# Patient Record
Sex: Female | Born: 1945 | Race: White | Hispanic: No | Marital: Married | Smoking: Never smoker
Health system: Southern US, Community
[De-identification: ages and names within clinical notes are randomized; demographics above are authoritative.]

## PROBLEM LIST (undated history)

## (undated) DIAGNOSIS — M4800 Spinal stenosis, site unspecified: Secondary | ICD-10-CM

## (undated) DIAGNOSIS — G4730 Sleep apnea, unspecified: Secondary | ICD-10-CM

## (undated) DIAGNOSIS — G43909 Migraine, unspecified, not intractable, without status migrainosus: Secondary | ICD-10-CM

## (undated) DIAGNOSIS — K5790 Diverticulosis of intestine, part unspecified, without perforation or abscess without bleeding: Secondary | ICD-10-CM

## (undated) DIAGNOSIS — I10 Essential (primary) hypertension: Secondary | ICD-10-CM

## (undated) DIAGNOSIS — C50919 Malignant neoplasm of unspecified site of unspecified female breast: Secondary | ICD-10-CM

## (undated) DIAGNOSIS — G4733 Obstructive sleep apnea (adult) (pediatric): Secondary | ICD-10-CM

## (undated) DIAGNOSIS — M810 Age-related osteoporosis without current pathological fracture: Secondary | ICD-10-CM

## (undated) DIAGNOSIS — L719 Rosacea, unspecified: Secondary | ICD-10-CM

## (undated) DIAGNOSIS — M1990 Unspecified osteoarthritis, unspecified site: Secondary | ICD-10-CM

## (undated) DIAGNOSIS — E119 Type 2 diabetes mellitus without complications: Secondary | ICD-10-CM

## (undated) DIAGNOSIS — H269 Unspecified cataract: Secondary | ICD-10-CM

## (undated) DIAGNOSIS — E785 Hyperlipidemia, unspecified: Secondary | ICD-10-CM

## (undated) HISTORY — DX: Essential (primary) hypertension: I10

## (undated) HISTORY — DX: Type 2 diabetes mellitus without complications: E11.9

## (undated) HISTORY — DX: Migraine, unspecified, not intractable, without status migrainosus: G43.909

## (undated) HISTORY — PX: SHOULDER ARTHROSCOPY: SHX128

## (undated) HISTORY — PX: POLYPECTOMY: SHX149

## (undated) HISTORY — DX: Obstructive sleep apnea (adult) (pediatric): G47.33

## (undated) HISTORY — DX: Rosacea, unspecified: L71.9

## (undated) HISTORY — DX: Unspecified cataract: H26.9

## (undated) HISTORY — DX: Sleep apnea, unspecified: G47.30

## (undated) HISTORY — DX: Diverticulosis of intestine, part unspecified, without perforation or abscess without bleeding: K57.90

## (undated) HISTORY — DX: Spinal stenosis, site unspecified: M48.00

## (undated) HISTORY — PX: COLONOSCOPY: SHX174

## (undated) HISTORY — PX: KNEE ARTHROSCOPY: SUR90

## (undated) HISTORY — DX: Unspecified osteoarthritis, unspecified site: M19.90

## (undated) HISTORY — PX: TONSILLECTOMY: SUR1361

## (undated) HISTORY — DX: Age-related osteoporosis without current pathological fracture: M81.0

## (undated) HISTORY — DX: Hyperlipidemia, unspecified: E78.5

---

## 1999-08-12 ENCOUNTER — Encounter: Admission: RE | Admit: 1999-08-12 | Discharge: 1999-08-12 | Payer: Self-pay | Admitting: Family Medicine

## 1999-08-12 ENCOUNTER — Encounter: Payer: Self-pay | Admitting: Family Medicine

## 2000-08-23 ENCOUNTER — Encounter: Payer: Self-pay | Admitting: Family Medicine

## 2000-08-23 ENCOUNTER — Encounter: Admission: RE | Admit: 2000-08-23 | Discharge: 2000-08-23 | Payer: Self-pay | Admitting: Family Medicine

## 2000-08-25 ENCOUNTER — Encounter: Payer: Self-pay | Admitting: Family Medicine

## 2000-08-25 ENCOUNTER — Encounter: Admission: RE | Admit: 2000-08-25 | Discharge: 2000-08-25 | Payer: Self-pay | Admitting: Family Medicine

## 2001-07-21 ENCOUNTER — Other Ambulatory Visit: Admission: RE | Admit: 2001-07-21 | Discharge: 2001-07-21 | Payer: Self-pay | Admitting: Family Medicine

## 2001-08-25 ENCOUNTER — Encounter: Payer: Self-pay | Admitting: Family Medicine

## 2001-08-25 ENCOUNTER — Encounter: Admission: RE | Admit: 2001-08-25 | Discharge: 2001-08-25 | Payer: Self-pay | Admitting: Family Medicine

## 2001-10-12 ENCOUNTER — Ambulatory Visit (HOSPITAL_BASED_OUTPATIENT_CLINIC_OR_DEPARTMENT_OTHER): Admission: RE | Admit: 2001-10-12 | Discharge: 2001-10-12 | Payer: Self-pay | Admitting: Family Medicine

## 2001-12-01 ENCOUNTER — Ambulatory Visit (HOSPITAL_BASED_OUTPATIENT_CLINIC_OR_DEPARTMENT_OTHER): Admission: RE | Admit: 2001-12-01 | Discharge: 2001-12-01 | Payer: Self-pay | Admitting: Family Medicine

## 2002-11-21 ENCOUNTER — Encounter: Admission: RE | Admit: 2002-11-21 | Discharge: 2002-11-21 | Payer: Self-pay | Admitting: Family Medicine

## 2002-11-21 ENCOUNTER — Encounter: Payer: Self-pay | Admitting: Family Medicine

## 2002-12-18 ENCOUNTER — Other Ambulatory Visit: Admission: RE | Admit: 2002-12-18 | Discharge: 2002-12-18 | Payer: Self-pay | Admitting: Family Medicine

## 2003-11-22 ENCOUNTER — Encounter: Admission: RE | Admit: 2003-11-22 | Discharge: 2003-11-22 | Payer: Self-pay | Admitting: Family Medicine

## 2004-01-11 ENCOUNTER — Other Ambulatory Visit: Admission: RE | Admit: 2004-01-11 | Discharge: 2004-01-11 | Payer: Self-pay | Admitting: Family Medicine

## 2005-01-02 ENCOUNTER — Encounter: Admission: RE | Admit: 2005-01-02 | Discharge: 2005-01-02 | Payer: Self-pay | Admitting: Family Medicine

## 2005-02-09 ENCOUNTER — Other Ambulatory Visit: Admission: RE | Admit: 2005-02-09 | Discharge: 2005-02-09 | Payer: Self-pay | Admitting: Family Medicine

## 2005-05-19 ENCOUNTER — Ambulatory Visit (HOSPITAL_COMMUNITY): Admission: RE | Admit: 2005-05-19 | Discharge: 2005-05-19 | Payer: Self-pay | Admitting: Orthopaedic Surgery

## 2005-05-27 ENCOUNTER — Other Ambulatory Visit: Admission: RE | Admit: 2005-05-27 | Discharge: 2005-05-27 | Payer: Self-pay | Admitting: Family Medicine

## 2005-09-17 ENCOUNTER — Encounter: Admission: RE | Admit: 2005-09-17 | Discharge: 2005-09-17 | Payer: Self-pay | Admitting: Family Medicine

## 2006-01-29 ENCOUNTER — Encounter: Admission: RE | Admit: 2006-01-29 | Discharge: 2006-01-29 | Payer: Self-pay | Admitting: Family Medicine

## 2006-09-30 ENCOUNTER — Encounter: Admission: RE | Admit: 2006-09-30 | Discharge: 2006-09-30 | Payer: Self-pay | Admitting: Family Medicine

## 2007-02-03 ENCOUNTER — Encounter: Admission: RE | Admit: 2007-02-03 | Discharge: 2007-02-03 | Payer: Self-pay | Admitting: Family Medicine

## 2007-08-04 ENCOUNTER — Emergency Department (HOSPITAL_COMMUNITY): Admission: EM | Admit: 2007-08-04 | Discharge: 2007-08-04 | Payer: Self-pay | Admitting: Emergency Medicine

## 2008-02-06 ENCOUNTER — Encounter: Admission: RE | Admit: 2008-02-06 | Discharge: 2008-02-06 | Payer: Self-pay | Admitting: Family Medicine

## 2009-02-12 ENCOUNTER — Encounter: Admission: RE | Admit: 2009-02-12 | Discharge: 2009-02-12 | Payer: Self-pay | Admitting: Family Medicine

## 2009-05-07 ENCOUNTER — Encounter: Admission: RE | Admit: 2009-05-07 | Discharge: 2009-05-07 | Payer: Self-pay | Admitting: Family Medicine

## 2010-02-13 ENCOUNTER — Encounter: Admission: RE | Admit: 2010-02-13 | Discharge: 2010-02-13 | Payer: Self-pay | Admitting: Family Medicine

## 2010-10-03 NOTE — Op Note (Signed)
NAMEMARIELOUISE, Linda Li              ACCOUNT NO.:  [id number]   MEDICAL RECORD NO.:  [id number]          PATIENT TYPE:  AMB   LOCATION:  SDS                          FACILITY:  [facility]   PHYSICIAN:  Claude Manges. Whitfield, M.D.DATE OF BIRTH:  1945/11/07   DATE OF PROCEDURE:  05/19/2005  DATE OF DISCHARGE:                                 OPERATIVE REPORT   PREOPERATIVE DIAGNOSIS:  Impingement left shoulder, with mild degenerative  joint disease acromioclavicular joint, and possible superior labrum anterior-  posterior lesion.   POSTOPERATIVE DIAGNOSES:  1.  Impingement left shoulder.  2.  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  3.  Type 2 superior labrum anterior-posterior lesion.  4.  Degenerative joint disease of glenohumeral joint.   PROCEDURES:  1.  Arthroscopic debridement left shoulder joint.  2.  Arthroscopic SLAP lesion repair.  3.  Arthroscopic subacromial decompression and removal of prominence beneath      the distal clavicle.   SURGEON:  Claude Manges. Cleophas Dunker, M.D.   ASSISTANT:  Legrand Pitts. Duffy, P.A.-C.   ANESTHESIA:  General orotracheal.   COMPLICATIONS:  None.   HISTORY:  This 65 year old female has had trouble off and on with her left  shoulder since she sustained a primary dislocation approximately 10 years  ago.  She has had some difficulty with pain in certain positions, even  sleeping and overhead movement.  She has had a recent MRI scan that revealed  a subacromial subdeltoid bursitis, supraspinatus tendinopathy, without  evidence of a full-thickness cuff tear, and a probable SLAP lesion.  There  is a question of an old [place] lesion.  There was also some degenerative  change at the [facility] joint, and some prominence beneath.   PROCEDURE:  With the patient comfortable on the operating room table and  under general orotracheal anesthesia, the patient was placed in the semi-  sitting position with the shoulder frame.  Evaluation revealed no  evidence  of dislocation or adhesive capsulitis.   The left shoulder was then prepped with DuraPrep from the base of the neck  circumferentially below the elbow.  Sterile draping was performed.   A marking pen was used to outline the [facility] joint, the coracoid, and the  acromion.  At the point a fingerbreadth posterior and medial to the  posterior angle of the acromion, a small stab wound was made.  The  arthroscope was then easily placed in the shoulder joint.  Diagnostic  arthroscopy revealed a chronic type 2 SLAP lesion that did not involve the  biceps anchor.  There was considerable synovitis and large areas of  articular cartilage flap tears.  Biceps tendon was intact.  There was one  small cartilaginous loose body that was retrieved from the inferior recess.  Second portal was established anteriorly, and the cannula inserted.  I  debrided the synovitis with the ArthroCare wand and the Kuda shaver.  Biceps  tendon was again checked, with evidence of a lesion.  I did not see evidence  of partial rotator cuff tear.  Shaving of the humeral head was then  performed with the shaver and  the ArthroCare wand, and a single 3x3 mm  cartilaginous loose body was removed.   A large 6 mm cannula was then inserted, and repair of the SLAP lesion was  performed using the Arthrex push-lock anchors.  Suture was then placed  through the labrum.  A drill hole was then made in the anterior glenoid just  proximal to the biceps anchor and the push-lock anchor inserted, with  excellent apposition of the labrum to the glenoid.  I did debride the  glenoid with the bur prior to insertion of the anchor.  A second anchor was  then placed inferior to the first for further purchase of the labrum, with  excellent reapproximation.  I thought I had a very stable construct.  The  joint was then inspected, without evidence of loose material.  When I  further debrided the humeral head, I was surprised at the amount of   chondromalacia.  I did not see exposed subchondral bone, but felt that I was  quite close in several locations.  I suspect the area of involvement was  probably the size of a half dollar.   The arthroscope was then placed in the subacromial space posteriorly, the  cannula placed in the subacromial space anteriorly, and a third portal  established in the lateral subacromial space.  Arthroscopy revealed  considerable bursal material.  This was debrided with the shaver and the  ArthroCare wand.  There was considerable overhang of the anterior acromion.  A 6 mm bur was used to perform an anterior inferior acromioplasty, with an  excellent decompression.  There was also some prominence of the distal  clavicle, and this was just removed so that it was now flush with the  surface of the remaining acromion, but I did not perform a distal clavicle  resection, as I felt that it was not symptomatic.   The arthroscope was then removed as well as the cannula.  The anterior and  lateral wounds were closed with 4-0 Ethilon.  0.25% Marcaine with  epinephrine was injected into the wound edges.  A sterile bulky dressing was  applied followed by a sling.   PLAN:  Discharge on Percocet for pain.  Office one week.      Claude Manges. Cleophas Dunker, M.D.  Electronically Signed     PWW/MEDQ  D:  05/19/2005  T:  05/19/2005  Job:  [id number]

## 2011-03-12 ENCOUNTER — Other Ambulatory Visit: Payer: Self-pay | Admitting: Family Medicine

## 2011-03-12 DIAGNOSIS — Z1231 Encounter for screening mammogram for malignant neoplasm of breast: Secondary | ICD-10-CM

## 2011-03-30 ENCOUNTER — Ambulatory Visit: Payer: Self-pay

## 2011-04-01 ENCOUNTER — Ambulatory Visit
Admission: RE | Admit: 2011-04-01 | Discharge: 2011-04-01 | Disposition: A | Discharge status: Home / Self Care | Payer: Medicare Other | Source: Ambulatory Visit | Attending: Family Medicine | Admitting: Family Medicine

## 2011-04-01 DIAGNOSIS — Z1231 Encounter for screening mammogram for malignant neoplasm of breast: Secondary | ICD-10-CM

## 2012-02-22 ENCOUNTER — Other Ambulatory Visit: Payer: Self-pay | Admitting: Family Medicine

## 2012-02-22 DIAGNOSIS — Z1231 Encounter for screening mammogram for malignant neoplasm of breast: Secondary | ICD-10-CM

## 2012-04-01 ENCOUNTER — Ambulatory Visit
Admission: RE | Admit: 2012-04-01 | Discharge: 2012-04-01 | Disposition: A | Discharge status: Home / Self Care | Payer: Medicare Other | Source: Ambulatory Visit | Attending: Family Medicine | Admitting: Family Medicine

## 2012-04-01 DIAGNOSIS — Z1231 Encounter for screening mammogram for malignant neoplasm of breast: Secondary | ICD-10-CM

## 2013-02-09 ENCOUNTER — Encounter: Payer: Self-pay | Admitting: Internal Medicine

## 2013-03-20 ENCOUNTER — Other Ambulatory Visit: Payer: Self-pay

## 2013-03-20 DIAGNOSIS — Z1231 Encounter for screening mammogram for malignant neoplasm of breast: Secondary | ICD-10-CM

## 2013-04-14 ENCOUNTER — Ambulatory Visit
Admission: RE | Admit: 2013-04-14 | Discharge: 2013-04-14 | Disposition: A | Discharge status: Home / Self Care | Payer: Medicare Other | Source: Ambulatory Visit

## 2013-04-14 DIAGNOSIS — Z1231 Encounter for screening mammogram for malignant neoplasm of breast: Secondary | ICD-10-CM

## 2013-10-14 ENCOUNTER — Encounter: Payer: Self-pay | Admitting: Internal Medicine

## 2013-12-25 ENCOUNTER — Other Ambulatory Visit (HOSPITAL_COMMUNITY): Payer: Self-pay | Admitting: Orthopaedic Surgery

## 2013-12-25 ENCOUNTER — Ambulatory Visit (HOSPITAL_COMMUNITY)
Admission: RE | Admit: 2013-12-25 | Discharge: 2013-12-25 | Disposition: A | Discharge status: Home / Self Care | Payer: Medicare Other | Source: Ambulatory Visit | Attending: Orthopaedic Surgery | Admitting: Orthopaedic Surgery

## 2013-12-25 DIAGNOSIS — M79661 Pain in right lower leg: Secondary | ICD-10-CM

## 2013-12-25 DIAGNOSIS — M79609 Pain in unspecified limb: Secondary | ICD-10-CM | POA: Insufficient documentation

## 2013-12-25 NOTE — Progress Notes (Signed)
Right lower extremity venous duplex completed.  Right:  No evidence of DVT, superficial thrombosis, or Baker's cyst.  Left:  Negative for DVT in the common femoral vein.  

## 2014-03-20 ENCOUNTER — Other Ambulatory Visit: Payer: Self-pay

## 2014-03-20 DIAGNOSIS — Z1231 Encounter for screening mammogram for malignant neoplasm of breast: Secondary | ICD-10-CM

## 2014-04-17 ENCOUNTER — Ambulatory Visit
Admission: RE | Admit: 2014-04-17 | Discharge: 2014-04-17 | Disposition: A | Discharge status: Home / Self Care | Payer: Medicare Other | Source: Ambulatory Visit

## 2014-04-17 DIAGNOSIS — Z1231 Encounter for screening mammogram for malignant neoplasm of breast: Secondary | ICD-10-CM

## 2014-04-18 ENCOUNTER — Encounter: Payer: Self-pay | Admitting: *Deleted

## 2014-06-18 ENCOUNTER — Other Ambulatory Visit: Payer: Self-pay | Admitting: Orthopaedic Surgery

## 2014-06-18 DIAGNOSIS — M545 Low back pain: Secondary | ICD-10-CM

## 2014-06-19 ENCOUNTER — Ambulatory Visit: Payer: 59

## 2014-06-25 ENCOUNTER — Ambulatory Visit
Admission: RE | Admit: 2014-06-25 | Discharge: 2014-06-25 | Disposition: A | Discharge status: Home / Self Care | Payer: 59 | Source: Ambulatory Visit | Attending: Orthopaedic Surgery | Admitting: Orthopaedic Surgery

## 2014-06-25 DIAGNOSIS — M545 Low back pain: Secondary | ICD-10-CM

## 2014-07-02 ENCOUNTER — Ambulatory Visit: Payer: 59 | Admitting: Sports Medicine

## 2014-07-03 ENCOUNTER — Encounter: Payer: Self-pay | Admitting: Sports Medicine

## 2014-07-03 ENCOUNTER — Ambulatory Visit (INDEPENDENT_AMBULATORY_CARE_PROVIDER_SITE_OTHER): Payer: 59 | Admitting: Sports Medicine

## 2014-07-03 VITALS — Ht 68.0 in | Wt 245.0 lb

## 2014-07-03 DIAGNOSIS — M4806 Spinal stenosis, lumbar region: Secondary | ICD-10-CM

## 2014-07-03 DIAGNOSIS — M48061 Spinal stenosis, lumbar region without neurogenic claudication: Secondary | ICD-10-CM

## 2014-07-03 NOTE — Progress Notes (Signed)
   Subjective:    Patient ID: Linda Li, female    DOB: [birth year], 69 y.o.   MRN: [id number]  HPI chief complaint: Low back pain and left leg pain  Very pleasant 69 year old female comes in today at the request of Dr. Durward Fortes and Biagio Borg for evaluation of left-sided low back pain and left thigh pain. No trauma. In the summer of last year she began to notice some weakness in the left hip as well as some burning type pain that was initially intermittent but about 3 weeks ago became more constant. She was seen and evaluated by Dr. Durward Fortes. X-rays and an MRI of her lumbar spine were done. Physical therapy was recommended but she was concerned about doing the wrong type of therapy. She had initially tried some Aleve but this did not help. She was then prescribed Celebrex which has been quite helpful. Her pain is most noticeable at night. It is currently tolerable during the day. She describes an aching/burning discomfort that begins in the left side of her low back and radiates into the lateral hip and down into her thigh. Pain does not radiate past her knee. She denies numbness or tingling. No groin pain. No prior low back surgeries. No symptoms in the right leg. She has had problems with her feet for many years. She has tried many types of orthotics. She has her orthotics with her today.  Past medical history reviewed Medications reviewed Allergies reviewed    Review of Systems    as above Objective:   Physical Exam Well-developed, obese. No acute distress. Awake alert and oriented 3. Vital signs reviewed.  Lumbar spine: Full painless lumbar range of motion. There is slight tenderness to palpation along the left lumbosacral area but nothing focal. No spasm. Negative straight leg raise bilaterally. Negative log roll bilaterally. She has 4+/5 strength with resisted great toe extension on the left compared to the right. Slight amount of weakness with resisted dorsiflexion compared  to the right as well. 5/5 strength with plantar flexion and hip flexion. No atrophy. Walking without a significant limp  MRI of her lumbar spine is reviewed. At L4-L5 she has severe facet arthropathy with bilateral joint effusions. 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       Assessment & Plan:  Low back pain and left leg pain secondary to spinal stenosis versus facet arthropathy  I recommended a diagnostic epidural steroid injection versus facet injection. It turns out that Biagio Borg has also recommended this. She will contact Aaron Edelman and have this set up with Dr. Ernestina Patches at [facility]. I think she should wait to start physical therapy until a definitive diagnosis is made. I do think that water aerobics would help her. She will continue with Celebrex as needed. I will remain available to see her as needed.  Of note, I evaluated her orthotics. They are well constructed custom orthotics with good arch support and a well-placed metatarsal pad. They are semi-rigid full-length orthotics. I do not think it would be helpful to try to reconstruct these.

## 2014-11-12 ENCOUNTER — Other Ambulatory Visit: Payer: Self-pay

## 2014-12-10 ENCOUNTER — Ambulatory Visit (AMBULATORY_SURGERY_CENTER): Payer: Self-pay

## 2014-12-10 VITALS — Ht 67.75 in | Wt 250.0 lb

## 2014-12-10 DIAGNOSIS — Z1211 Encounter for screening for malignant neoplasm of colon: Secondary | ICD-10-CM

## 2014-12-10 MED ORDER — SUPREP BOWEL PREP KIT 17.5-3.13-1.6 GM/177ML PO SOLN: 1.0000 | Freq: Once | ORAL | Status: DC

## 2014-12-10 NOTE — Progress Notes (Signed)
OSA uses CPAP but no home oxygen No allergies to eggs or soy No diet/weight loss meds No past problems with anesthesia  Does not want emmi video

## 2014-12-13 ENCOUNTER — Encounter: Payer: Self-pay | Admitting: Internal Medicine

## 2014-12-13 ENCOUNTER — Encounter: Payer: Self-pay | Admitting: Gastroenterology

## 2014-12-26 ENCOUNTER — Encounter: Payer: 59 | Admitting: Internal Medicine

## 2015-02-14 ENCOUNTER — Encounter: Payer: Self-pay | Admitting: Gastroenterology

## 2015-02-14 ENCOUNTER — Ambulatory Visit (AMBULATORY_SURGERY_CENTER): Payer: Medicare Other | Admitting: Gastroenterology

## 2015-02-14 VITALS — BP 128/74 | HR 66 | Temp 97.7°F | Resp 18 | Ht 67.0 in | Wt 250.0 lb

## 2015-02-14 DIAGNOSIS — D120 Benign neoplasm of cecum: Secondary | ICD-10-CM | POA: Diagnosis not present

## 2015-02-14 DIAGNOSIS — Z1211 Encounter for screening for malignant neoplasm of colon: Secondary | ICD-10-CM | POA: Diagnosis present

## 2015-02-14 DIAGNOSIS — D122 Benign neoplasm of ascending colon: Secondary | ICD-10-CM

## 2015-02-14 LAB — GLUCOSE, CAPILLARY
Glucose-Capillary: 90 mg/dL (ref 65–99)
Glucose-Capillary: 113 mg/dL — ABNORMAL HIGH (ref 65–99)

## 2015-02-14 MED ORDER — SODIUM CHLORIDE 0.9 % IV SOLN: 500.0000 mL | INTRAVENOUS | Status: DC

## 2015-02-14 NOTE — Progress Notes (Signed)
Report to PACU, RN, vss, BBS= Clear.  

## 2015-02-14 NOTE — Progress Notes (Signed)
Called to room to assist during endoscopic procedure.  Patient ID and intended procedure confirmed with present staff. Received instructions for my participation in the procedure from the performing physician.  

## 2015-02-14 NOTE — Op Note (Addendum)
[facility]  [address] [place], [address]   COLONOSCOPY PROCEDURE REPORT  PATIENT: Linda Li, Linda Li  MR#: [id number] BIRTHDATE: Jan 22, 1946 , 69  yrs. old GENDER: female ENDOSCOPIST: Defiance Cellar, MD REFERRED BY: Joneen Caraway, MD PROCEDURE DATE:  02/14/2015 PROCEDURE:   Colonoscopy with snare polypectomy and Colonoscopy with biopsy First Screening Colonoscopy - Avg.  risk and is 50 yrs.  old or older - No.  Prior Negative Screening - Now for repeat screening. 10 or more years since last screening  History of Adenoma - Now for follow-up colonoscopy & has been > or = to 3 yrs.  N/A  Polyps removed today? Yes ASA CLASS:   Class II INDICATIONS:Screening for colonic neoplasia and Colorectal Neoplasm Risk Assessment for this procedure is average risk. MEDICATIONS: Propofol 400 mg IV and Lidocaine 200 mg IV  DESCRIPTION OF PROCEDURE:   After the risks benefits and alternatives of the procedure were thoroughly explained, informed consent was obtained.  The digital rectal exam revealed no abnormalities of the rectum.   The LB PCF Q180 [id number]  endoscope was introduced through the anus and advanced to the cecum, which was identified by both the appendix and ileocecal valve. No adverse events experienced.   The quality of the prep was adequate  The instrument was then slowly withdrawn as the colon was fully examined. Estimated blood loss is zero unless otherwise noted in this procedure report.   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 Two sessile polyps measuring 3 mm in size were found at the cecum.  Polypectomies were performed with cold forceps.  The resection was complete, the polyp tissue was completely retrieved and sent to histology.   A sessile polyp measuring 5 mm in size was found at the cecum.  A polypectomy was performed with a cold snare. The resection was complete, the polyp tissue was completely retrieved and  sent to histology.   Three sessile polyps measuring 3 mm in size were found in the ascending colon.  Polypectomies were performed with cold forceps.  The resection was complete, the polyp tissue was completely retrieved and sent to histology.   There was moderate diverticulosis noted in the sigmoid colon.   The examination was otherwise normal.  Retroflexed views revealed internal hemorrhoids. The time to cecum = 5.9 Withdrawal time = 22.9   The scope was withdrawn and the procedure completed. COMPLICATIONS: There were no immediate complications.   ENDOSCOPIC IMPRESSION: 1.   Two sessile polyps were found at the cecum; polypectomies were performed with cold forceps 2.   Sessile polyp was found at the cecum; polypectomy was performed with a cold snare 3.   Three sessile polyps were found in the ascending colon; polypectomies were performed with cold forceps 4.   Moderate diverticulosis was noted in the sigmoid colon 5.   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 6..   The examination was otherwise normal  RECOMMENDATIONS: 1.  Hold Aspirin and all other NSAIDS for 2 weeks. 2.  Resume diet 3.  Resume medications 4.  Await pathology results, further recommendations pending results.  eSigned:  Atoka Cellar, MD 02/14/2015 9:55 AM Revised: 02/14/2015 9:55 AM  cc: Joneen Caraway MD, the patient   PATIENT NAME:  Linda Li, Linda Li MR#: [id number]

## 2015-02-14 NOTE — Patient Instructions (Signed)
AVOID ASPIRIN, ASPIRIN PRODUCTS AND NSAIDS ( ADVIL, IBUPROFEN, MOTRIN, ALEVE, ETC) FOR TWO WEEKS, October 13,2016.   YOU HAD AN ENDOSCOPIC PROCEDURE TODAY AT [facility]:   Refer to the procedure report that was given to you for any specific questions about what was found during the examination.  If the procedure report does not answer your questions, please call your gastroenterologist to clarify.  If you requested that your care partner not be given the details of your procedure findings, then the procedure report has been included in a sealed envelope for you to review at your convenience later.  YOU SHOULD EXPECT: Some feelings of bloating in the abdomen. Passage of more gas than usual.  Walking can help get rid of the air that was put into your GI tract during the procedure and reduce the bloating. If you had a lower endoscopy (such as a colonoscopy or flexible sigmoidoscopy) you may notice spotting of blood in your stool or on the toilet paper. If you underwent a bowel prep for your procedure, you may not have a normal bowel movement for a few days.  Please Note:  You might notice some irritation and congestion in your nose or some drainage.  This is from the oxygen used during your procedure.  There is no need for concern and it should clear up in a day or so.  SYMPTOMS TO REPORT IMMEDIATELY:   Following lower endoscopy (colonoscopy or flexible sigmoidoscopy):  Excessive amounts of blood in the stool  Significant tenderness or worsening of abdominal pains  Swelling of the abdomen that is new, acute  Fever of 100F or higher   For urgent or emergent issues, a gastroenterologist can be reached at any hour by calling [phone].   DIET: Your first meal following the procedure should be a small meal and then it is ok to progress to your normal diet. Heavy or fried foods are harder to digest and may make you feel nauseous or bloated.  Likewise, meals heavy in dairy and  vegetables can increase bloating.  Drink plenty of fluids but you should avoid alcoholic beverages for 24 hours.  ACTIVITY:  You should plan to take it easy for the rest of today and you should NOT DRIVE or use heavy machinery until tomorrow (because of the sedation medicines used during the test).    FOLLOW UP: Our staff will call the number listed on your records the next business day following your procedure to check on you and address any questions or concerns that you may have regarding the information given to you following your procedure. If we do not reach you, we will leave a message.  However, if you are feeling well and you are not experiencing any problems, there is no need to return our call.  We will assume that you have returned to your regular daily activities without incident.  If any biopsies were taken you will be contacted by phone or by letter within the next 1-3 weeks.  Please call us at [phone] if you have not heard about the biopsies in 3 weeks.    SIGNATURES/CONFIDENTIALITY: You and/or your care partner have signed paperwork which will be entered into your electronic medical record.  These signatures attest to the fact that that the information above on your After Visit Summary has been reviewed and is understood.  Full responsibility of the confidentiality of this discharge information lies with you and/or your care-partner.

## 2015-02-15 ENCOUNTER — Telehealth: Payer: Self-pay | Admitting: *Deleted

## 2015-02-15 NOTE — Telephone Encounter (Signed)
  Follow up Call-  Call back number 02/14/2015  Post procedure Call Back phone  # [phone]  Permission to leave phone message Yes     Patient questions:  Do you have a fever, pain , or abdominal swelling? No. Pain Score  0 *  Have you tolerated food without any problems? Yes.    Have you been able to return to your normal activities? Yes.    Do you have any questions about your discharge instructions: Diet   No. Medications  No. Follow up visit  No.  Do you have questions or concerns about your Care? No.  Actions: * If pain score is 4 or above: No action needed, pain <4.

## 2015-02-18 ENCOUNTER — Encounter: Payer: Self-pay | Admitting: Sports Medicine

## 2015-02-18 ENCOUNTER — Ambulatory Visit (INDEPENDENT_AMBULATORY_CARE_PROVIDER_SITE_OTHER): Payer: Medicare Other | Admitting: Sports Medicine

## 2015-02-18 VITALS — BP 142/81 | HR 79 | Ht 68.0 in | Wt 250.0 lb

## 2015-02-18 DIAGNOSIS — M25562 Pain in left knee: Secondary | ICD-10-CM

## 2015-02-18 NOTE — Progress Notes (Signed)
   Subjective:    Patient ID: Linda Li, female    DOB: 1945-12-25, 69 y.o.   MRN: [id number]  HPI69 y/o female who presents with left knee pain of 2 days duration.  She has a history of low back pain with radicular symptoms that have been treated with epidural steroid injections with relief.  She does have a h/o arthritis to b/l knees, had an arthroscopic meniscal repair on the right knee and no surgeries on the left knee.  She states that she noticed her left knee bother her after a colonoscopy that she had on Thursday of last week.  It feels swollen and has a dull pain.  She struggled to walk due to discomfort over the weekend.  She denies any mechanical symptoms- popping, locking, catching. Discomfort is primarily in the posterior aspect of her knee.     Review of Systems Gen: Denies fevers, chills MSK: +joint pain in left knee, +left knee swelling Skin: Denies erythema or warmth    Objective:   Physical Exam  Constitutional: She is oriented to person, place, and time. She appears well-developed and well-nourished.  Neurological: She is alert and oriented to person, place, and time.   Left knee: Range of motion 0-120. Trace effusion. She is tender to palpation along medial and lateral joint lines. 2+ patellofemoral crepitus. Negative McMurray's. No ligament is laxity. No popliteal cyst. Neurovascular intact distally.  Skin: no erythema, warmth over bilateral knees       Assessment & Plan:  Left knee osteoarthritis- Recommend knee compression sleeve, given to patient in office.  Recommend OTC tylenol for pain for now.  Would prefer OTC aleve, if cleared by gastroenterologist, given recent colonoscopy with possible polyps removed.  Follow up if not improved and can consider repeat x-rays, steroid injection if indicated.

## 2015-02-21 ENCOUNTER — Encounter: Payer: Self-pay | Admitting: Gastroenterology

## 2015-03-19 ENCOUNTER — Other Ambulatory Visit: Payer: Self-pay | Admitting: Family Medicine

## 2015-03-19 DIAGNOSIS — E2839 Other primary ovarian failure: Secondary | ICD-10-CM

## 2015-03-19 DIAGNOSIS — Z139 Encounter for screening, unspecified: Secondary | ICD-10-CM

## 2015-04-19 ENCOUNTER — Other Ambulatory Visit: Payer: Self-pay | Admitting: Family Medicine

## 2015-04-19 DIAGNOSIS — E2839 Other primary ovarian failure: Secondary | ICD-10-CM

## 2015-04-24 ENCOUNTER — Ambulatory Visit
Admission: RE | Admit: 2015-04-24 | Discharge: 2015-04-24 | Disposition: A | Discharge status: Home / Self Care | Payer: Medicare Other | Source: Ambulatory Visit | Attending: Family Medicine | Admitting: Family Medicine

## 2015-04-24 DIAGNOSIS — E2839 Other primary ovarian failure: Secondary | ICD-10-CM

## 2015-04-24 DIAGNOSIS — Z139 Encounter for screening, unspecified: Secondary | ICD-10-CM

## 2015-04-29 ENCOUNTER — Other Ambulatory Visit: Payer: Self-pay | Admitting: Orthopaedic Surgery

## 2015-04-29 DIAGNOSIS — M25562 Pain in left knee: Secondary | ICD-10-CM

## 2015-05-15 ENCOUNTER — Ambulatory Visit
Admission: RE | Admit: 2015-05-15 | Discharge: 2015-05-15 | Disposition: A | Discharge status: Home / Self Care | Payer: Medicare Other | Source: Ambulatory Visit | Attending: Orthopaedic Surgery | Admitting: Orthopaedic Surgery

## 2015-05-15 DIAGNOSIS — M25562 Pain in left knee: Secondary | ICD-10-CM

## 2015-05-29 ENCOUNTER — Other Ambulatory Visit: Payer: Medicare Other

## 2015-07-02 ENCOUNTER — Ambulatory Visit
Admission: RE | Admit: 2015-07-02 | Discharge: 2015-07-02 | Disposition: A | Discharge status: Home / Self Care | Payer: Medicare Other | Source: Ambulatory Visit | Attending: Family Medicine | Admitting: Family Medicine

## 2016-03-23 ENCOUNTER — Other Ambulatory Visit: Payer: Self-pay | Admitting: Family Medicine

## 2016-03-23 DIAGNOSIS — Z1231 Encounter for screening mammogram for malignant neoplasm of breast: Secondary | ICD-10-CM

## 2016-05-01 ENCOUNTER — Ambulatory Visit
Admission: RE | Admit: 2016-05-01 | Discharge: 2016-05-01 | Disposition: A | Discharge status: Home / Self Care | Payer: Medicare Other | Source: Ambulatory Visit | Attending: Family Medicine | Admitting: Family Medicine

## 2016-05-01 DIAGNOSIS — Z1231 Encounter for screening mammogram for malignant neoplasm of breast: Secondary | ICD-10-CM

## 2016-05-05 ENCOUNTER — Other Ambulatory Visit: Payer: Self-pay | Admitting: Family Medicine

## 2016-05-05 DIAGNOSIS — R928 Other abnormal and inconclusive findings on diagnostic imaging of breast: Secondary | ICD-10-CM

## 2016-05-12 ENCOUNTER — Ambulatory Visit
Admission: RE | Admit: 2016-05-12 | Discharge: 2016-05-12 | Disposition: A | Discharge status: Home / Self Care | Payer: Medicare Other | Source: Ambulatory Visit | Attending: Family Medicine | Admitting: Family Medicine

## 2016-05-12 DIAGNOSIS — R928 Other abnormal and inconclusive findings on diagnostic imaging of breast: Secondary | ICD-10-CM

## 2016-08-24 ENCOUNTER — Telehealth (INDEPENDENT_AMBULATORY_CARE_PROVIDER_SITE_OTHER): Payer: Self-pay | Admitting: Orthopaedic Surgery

## 2016-08-24 NOTE — Telephone Encounter (Signed)
Send new referralfor eval and treat-thanks

## 2016-08-24 NOTE — Telephone Encounter (Signed)
Patient called stating that she has been seeing Dr. Ernestina Patches for injections in her left hip/back area and now she is having the same problems on the right side.  She is hoping that she does not have to get another MRI.  She is wanting to see Dr. Ernestina Patches for this problem as well, but Dr. Ernestina Patches needs a statement or referral for her to see him for this, so that Dr. Ernestina Patches knows it is not the same problem he has been seeing her for.  CB#[phone]

## 2016-08-24 NOTE — Telephone Encounter (Signed)
Please advise 

## 2016-08-25 ENCOUNTER — Telehealth (INDEPENDENT_AMBULATORY_CARE_PROVIDER_SITE_OTHER): Payer: Self-pay | Admitting: Physical Medicine and Rehabilitation

## 2016-08-25 ENCOUNTER — Other Ambulatory Visit (INDEPENDENT_AMBULATORY_CARE_PROVIDER_SITE_OTHER): Payer: Self-pay | Admitting: Physical Medicine and Rehabilitation

## 2016-08-25 ENCOUNTER — Other Ambulatory Visit (INDEPENDENT_AMBULATORY_CARE_PROVIDER_SITE_OTHER): Payer: Self-pay

## 2016-08-25 DIAGNOSIS — M545 Low back pain, unspecified: Secondary | ICD-10-CM

## 2016-08-25 DIAGNOSIS — M5416 Radiculopathy, lumbar region: Secondary | ICD-10-CM

## 2016-08-25 DIAGNOSIS — F411 Generalized anxiety disorder: Secondary | ICD-10-CM

## 2016-08-25 DIAGNOSIS — G8929 Other chronic pain: Secondary | ICD-10-CM

## 2016-08-25 MED ORDER — HYDROXYZINE PAMOATE 50 MG PO CAPS: ORAL_CAPSULE | ORAL | 1 refills | Status: DC

## 2016-08-25 NOTE — Telephone Encounter (Signed)
Sent referral 

## 2016-08-25 NOTE — Progress Notes (Signed)
Sent Vistoril for pre injection sedation

## 2016-08-26 MED ORDER — TRAMADOL HCL 50 MG PO TABS: 50.0000 mg | ORAL_TABLET | Freq: Three times a day (TID) | ORAL | 0 refills | Status: DC | PRN

## 2016-08-26 NOTE — Telephone Encounter (Signed)
Tramadol printed you can fax

## 2016-08-26 NOTE — Telephone Encounter (Signed)
Faxed and patient notified. 

## 2016-09-07 ENCOUNTER — Ambulatory Visit (INDEPENDENT_AMBULATORY_CARE_PROVIDER_SITE_OTHER): Payer: Medicare Other | Admitting: Physical Medicine and Rehabilitation

## 2016-09-07 ENCOUNTER — Ambulatory Visit (INDEPENDENT_AMBULATORY_CARE_PROVIDER_SITE_OTHER): Payer: Self-pay

## 2016-09-07 ENCOUNTER — Encounter (INDEPENDENT_AMBULATORY_CARE_PROVIDER_SITE_OTHER): Payer: Self-pay | Admitting: Physical Medicine and Rehabilitation

## 2016-09-07 VITALS — BP 142/69 | HR 76

## 2016-09-07 DIAGNOSIS — M25551 Pain in right hip: Secondary | ICD-10-CM | POA: Diagnosis not present

## 2016-09-07 DIAGNOSIS — M48062 Spinal stenosis, lumbar region with neurogenic claudication: Secondary | ICD-10-CM

## 2016-09-07 DIAGNOSIS — M5416 Radiculopathy, lumbar region: Secondary | ICD-10-CM | POA: Diagnosis not present

## 2016-09-07 DIAGNOSIS — M4316 Spondylolisthesis, lumbar region: Secondary | ICD-10-CM

## 2016-09-07 MED ORDER — LIDOCAINE HCL (PF) 1 % IJ SOLN: 0.3300 mL | Freq: Once | INTRAMUSCULAR | Status: AC

## 2016-09-07 MED ORDER — METHYLPREDNISOLONE ACETATE 80 MG/ML IJ SUSP: 80.0000 mg | Freq: Once | INTRAMUSCULAR | Status: DC

## 2016-09-07 NOTE — Procedures (Deleted)
Lumbosacral Transforaminal Epidural Steroid Injection - Infraneural Approach with Fluoroscopic Guidance  Patient: Linda Li      Date of Birth: 1945-11-28 MRN: [id number] PCP: Suzanna Obey, MD      Visit Date: 09/07/2016   Universal Protocol:    Date/Time: 04/23/188:18 AM  Consent Given By: the patient  Position: PRONE   Additional Comments: Vital signs were monitored before and after the procedure. Patient was prepped and draped in the usual sterile fashion. The correct patient, procedure, and site was verified.   Injection Procedure Details:  Procedure Site One Meds Administered:  Meds ordered this encounter  Medications  . lidocaine (PF) (XYLOCAINE) 1 % injection 0.3 mL  . methylPREDNISolone acetate (DEPO-MEDROL) injection 80 mg      Laterality: Right  Location/Site:  L4-L5  Needle size: 22 G  Needle type: Spinal  Needle Placement: Transforaminal  Findings:  -Contrast Used: 1 mL iohexol 180 mg iodine/mL   -Comments: Excellent flow of contrast along the nerve and into the epidural space.  Procedure Details: After squaring off the end-plates of the desired vertebral level to get a true AP view, the C-arm was obliqued to the painful side so that the superior articulating process is positioned about 1/3 the length of the inferior endplate.  The needle was aimed toward the junction of the superior articular process and the transverse process of the inferior vertebrae. The needle's initial entry is in the lower third of the foramen through Kambin's triangle. The soft tissues overlying this target were infiltrated with 2-3 ml. of 1% Lidocaine without Epinephrine.  The spinal needle was then inserted and advanced toward the target using a "trajectory" view along the fluoroscope beam.  Under AP and lateral visualization, the needle was advanced so it did not puncture dura and did not traverse medially beyond the 6 o'clock position of the pedicle. Bi-planar projections  were used to confirm position. Aspiration was confirmed to be negative for CSF and/or blood. A 1-2 ml. volume of Isovue-250 was injected and flow of contrast was noted at each level. Radiographs were obtained for documentation purposes.   After attaining the desired flow of contrast documented above, a 0.5 to 1.0 ml test dose of 0.25% Marcaine was injected into each respective transforaminal space.  The patient was observed for 90 seconds post injection.  After no sensory deficits were reported, and normal lower extremity motor function was noted,   the above injectate was administered so that equal amounts of the injectate were placed at each foramen (level) into the transforaminal epidural space.   Additional Comments:  The patient tolerated the procedure well Dressing: Band-Aid    Post-procedure details: Patient was observed during the procedure. Post-procedure instructions were reviewed.  Patient left the clinic in stable condition.

## 2016-09-07 NOTE — Progress Notes (Deleted)
Was having increased pain right side back pain for around 10 days to 2 weeks. Into right thigh. Was on vacation and had to come home due to the pain. However she states she has no pain for the past 3 or 4 days. Has been doing her regular activity. Was staying in their camper at the time that the pain was bad so not sure if it was because of the bad mattress

## 2016-09-07 NOTE — Progress Notes (Signed)
Linda Li - 71 y.o. female MRN [id number]  Date of birth: 06-22-1945  Office Visit Note: Visit Date: 09/07/2016 PCP: Suzanna Obey, MD Referred by: Katherina Mires, MD  Subjective: Chief Complaint  Patient presents with  . Lower Back - Pain   HPI: Linda Li is a 71 year old female with known facet arthropathy with listhesis and stenosis at L4-5 who is done well in the past with epidural injection. She was recently on a trip to [place] where they were sleeping in a camper and a confined space and she began having a lot of right thigh pain. Her pain pattern is interesting that she's always had more right anterior thigh pain more of an L4 distribution but really more prominent when she lays down either on her back or on her sides. She can ambulate without aid and she can ambulate at a pretty good distance with a lot of leg pain. She has back pain that in general is constant and always there but it doesn't seem to limit what she can do. She's had no new trauma. They did have to return from their trip fairly early because of the severe pain. Over the last 3 or 4 days the pain has subsided quite a bit. I did prescribe some tramadol. I did also give her Vistaril pre-injection. She has not had any paresthesias or focal weakness. No bowel or bladder issues.    Review of Systems  Constitutional: Negative for chills, fever, malaise/fatigue and weight loss.  HENT: Negative for hearing loss and sinus pain.   Eyes: Negative for blurred vision, double vision and photophobia.  Respiratory: Negative for cough and shortness of breath.   Cardiovascular: Negative for chest pain, palpitations and leg swelling.  Gastrointestinal: Negative for abdominal pain, nausea and vomiting.  Genitourinary: Negative for flank pain.  Musculoskeletal: Positive for back pain. Negative for myalgias.  Skin: Negative for itching and rash.  Neurological: Negative for tremors, focal weakness and weakness.    Endo/Heme/Allergies: Negative.   Psychiatric/Behavioral: Negative for depression.  All other systems reviewed and are negative.  Otherwise per HPI.  Assessment & Plan: Visit Diagnoses:  1. Lumbar radiculopathy   2. Spinal stenosis of lumbar region with neurogenic claudication   3. Spondylolisthesis of lumbar region   4. Pain in right hip     Plan: Findings:  Chronic severe at times intermittent low back and more right hip and thigh pain. She also gets similar symptoms on the left but of much less intensity. Right now she is exiting doing fairly well. Her symptoms of common over the last week. She did have severe pain when she made the appointment to come in. I think at this point is still consistent with stenosis and nerve root irritation. As of right now or not going to complete the injection we'll see how she does. She should stay active and talked about activity modification. We also talked about prescribing prednisone to take with her on a trip if she does go on a trip to see if that would help her if she needed it. Otherwise and a see her back as needed and she'll continue to work on being active and losing weight.    Meds & Orders:  Meds ordered this encounter  Medications  . lidocaine (PF) (XYLOCAINE) 1 % injection 0.3 mL  . methylPREDNISolone acetate (DEPO-MEDROL) injection 80 mg   No orders of the defined types were placed in this encounter.   Follow-up: Return if symptoms worsen or fail  to improve.   Procedures: No procedures performed  No notes on file   Clinical History: Lumbar spine MRI  06/25/2014  L4-L5: 6 mm anterolisthesis. Central bulging annular fibers. Severe facet arthropathy with BILATERAL joint effusions and ligamentum flavum hypertrophy. Moderate to severe central canal stenosis. BILATERAL subarticular zone narrowing without foraminal narrowing. Either L5 nerve root could be affected.  L5-S1: Disc space narrowing with central disc osteophyte  complex extending primarily into the LEFT neural foramen. Trace retrolisthesis. Posterior element hypertrophy. LEFT subarticular zone and foraminal zone narrowing without definite L5 or S1 nerve root impingement.  IMPRESSION: 6 mm of degenerative spondylolisthesis at L4-5. Moderate to severe central canal stenosis with BILATERAL subarticular zone narrowing which could affect either L5 nerve root.  Asymmetric LEFT subarticular zone and foraminal zone narrowing at L5-S1 due to disc osteophyte complex, bony overgrowth, trace retrolisthesis, and facet hypertrophy. Clear-cut LEFT-sided nerve root impingement is not established.  She reports that she has never smoked. She has never used smokeless tobacco. No results for input(s): HGBA1C, LABURIC in the last 8760 hours.  Objective:  VS:  HT:    WT:   BMI:     BP:(!) 142/69  HR:76bpm  TEMP: ( )  RESP:93 % Physical Exam  Constitutional: She is oriented to person, place, and time. She appears well-developed and well-nourished.  Eyes: Conjunctivae and EOM are normal. Pupils are equal, round, and reactive to light.  Cardiovascular: Normal rate and intact distal pulses.   Pulmonary/Chest: Effort normal.  Abdominal: Soft. She exhibits no distension.  Musculoskeletal:  Patient ambulates without aid with a slightly forward flexed spine. She has pain with extension of the lumbar spine. She has no pain with hip rotation she has good distal strength.  Neurological: She is alert and oriented to person, place, and time.  Skin: Skin is warm and dry. No rash noted. No erythema.  Psychiatric: She has a normal mood and affect. Her behavior is normal.  Nursing note and vitals reviewed.   Ortho Exam Imaging: No results found.  Past Medical/Family/Surgical/Social History: Medications & Allergies reviewed per EMR There are no active problems to display for this patient.  Past Medical History:  Diagnosis Date  . Diabetes mellitus ([place])   .  Diverticulosis   . Migraines   . Obstructive sleep apnea    pressure is 14  . Rosacea   . Spinal stenosis    Family History  Problem Relation Age of Onset  . Colon cancer Neg Hx    Past Surgical History:  Procedure Laterality Date  . COLONOSCOPY    . KNEE ARTHROSCOPY     right torn meniscus  . SHOULDER ARTHROSCOPY     left torn ligament  . TONSILLECTOMY     Social History   Occupational History  . Not on file.   Social History Main Topics  . Smoking status: Never Smoker  . Smokeless tobacco: Never Used  . Alcohol use No  . Drug use: No  . Sexual activity: Not on file

## 2017-04-19 ENCOUNTER — Other Ambulatory Visit: Payer: Self-pay | Admitting: Family Medicine

## 2017-04-19 DIAGNOSIS — Z1231 Encounter for screening mammogram for malignant neoplasm of breast: Secondary | ICD-10-CM

## 2017-05-19 ENCOUNTER — Ambulatory Visit
Admission: RE | Admit: 2017-05-19 | Discharge: 2017-05-19 | Disposition: A | Discharge status: Home / Self Care | Payer: Medicare Other | Source: Ambulatory Visit | Attending: Family Medicine | Admitting: Family Medicine

## 2017-05-19 DIAGNOSIS — Z1231 Encounter for screening mammogram for malignant neoplasm of breast: Secondary | ICD-10-CM

## 2017-08-03 ENCOUNTER — Telehealth (INDEPENDENT_AMBULATORY_CARE_PROVIDER_SITE_OTHER): Payer: Self-pay | Admitting: Physical Medicine and Rehabilitation

## 2017-08-03 ENCOUNTER — Other Ambulatory Visit (INDEPENDENT_AMBULATORY_CARE_PROVIDER_SITE_OTHER): Payer: Self-pay | Admitting: Physical Medicine and Rehabilitation

## 2017-08-03 MED ORDER — PREDNISONE 50 MG PO TABS: ORAL_TABLET | ORAL | 0 refills | Status: DC

## 2017-08-03 NOTE — Telephone Encounter (Signed)
Patient notified/ advised.

## 2017-08-03 NOTE — Telephone Encounter (Signed)
I did rx the prednisone. HbA1c in 6 range is ok. She will need to monitor BS and stay hydrated. If not helpful the ov after trip

## 2017-08-03 NOTE — Progress Notes (Signed)
Prednisone pre-trip for h/o of back pain and stenosis. Will need to monitor blood sugar and stay hydrated

## 2018-03-14 ENCOUNTER — Encounter: Payer: Self-pay | Admitting: Gastroenterology

## 2018-03-30 ENCOUNTER — Ambulatory Visit: Payer: Medicare Other | Admitting: Internal Medicine

## 2018-04-20 ENCOUNTER — Other Ambulatory Visit: Payer: Self-pay | Admitting: Family Medicine

## 2018-04-20 DIAGNOSIS — Z1231 Encounter for screening mammogram for malignant neoplasm of breast: Secondary | ICD-10-CM

## 2018-05-06 ENCOUNTER — Encounter (INDEPENDENT_AMBULATORY_CARE_PROVIDER_SITE_OTHER): Payer: Self-pay | Admitting: Orthopaedic Surgery

## 2018-05-06 ENCOUNTER — Ambulatory Visit (INDEPENDENT_AMBULATORY_CARE_PROVIDER_SITE_OTHER): Payer: Medicare Other | Admitting: Orthopaedic Surgery

## 2018-05-06 ENCOUNTER — Ambulatory Visit (INDEPENDENT_AMBULATORY_CARE_PROVIDER_SITE_OTHER): Payer: Medicare Other

## 2018-05-06 VITALS — Ht 68.0 in | Wt 260.0 lb

## 2018-05-06 DIAGNOSIS — M25532 Pain in left wrist: Secondary | ICD-10-CM | POA: Diagnosis not present

## 2018-05-06 NOTE — Progress Notes (Signed)
Office Visit Note   Patient: Linda Li           Date of Birth: [birth year]           MRN: [id number] Visit Date: 05/06/2018              Requested by: Katherina Mires, MD [address] [place], [place] [address] PCP: Katherina Mires, MD   Assessment & Plan: Visit Diagnoses:  1. Pain in left wrist     Plan: Injury to left nondominant wrist about 5-1/2 weeks ago.  Films demonstrate a nondisplaced fracture transversely of the distal radius which would explain her pain.  Appears that the fracture is healing.  No neurologic deficit.  May wear the splint and work with range of motion exercises necessary.  Treatment at this point is simply control which is quite mild  Follow-Up Instructions: Return if symptoms worsen or fail to improve.   Orders:  Orders Placed This Encounter  Procedures  . XR Wrist Complete Left   No orders of the defined types were placed in this encounter.     Procedures: No procedures performed   Clinical Data: No additional findings.   Subjective: Chief Complaint  Patient presents with  . Left Wrist - Pain  . Wrist Pain    fell about 1 month ago and injury left wrist , when [facility] was told it was a sprain done xrays  still having pain used ice pack wore a brace didn't help ,   Linda Li is 72 years old visited the office with a about a 5-1/2 to 6-week history of left wrist pain.  She actually fell injuring her wrist.  Several days after the injury with swelling and ecchymosis she visited in after-hours clinic.  X-rays of her wrist were negative.  Volar wrist splint but still having some discomfort thus visiting the office.  She is left hand nondominant.  She is not had any numbness or tingling  HPI  Review of Systems   Objective: Vital Signs: Ht 5\' 8"  (1.727 m)   Wt 260 lb (117.9 kg)   BMI 39.53 kg/m   Physical Exam Constitutional:      Appearance: She is well-developed.  Eyes:     Pupils: Pupils are equal,  round, and reactive to light.  Pulmonary:     Effort: Pulmonary effort is normal.  Skin:    General: Skin is warm and dry.  Neurological:     Mental Status: She is alert and oriented to person, place, and time.  Psychiatric:        Behavior: Behavior normal.     Ortho Exam awake alert and oriented x3.  Comfortable sitting.  Minimal swelling of left wrist.  Some tenderness along the distal ulna.  Mild tenderness over the distal radius.  No obvious deformity.  No swelling of the digits.  Neurovascular exam is intact.  Full motion of her fingers with good grip and good release.  Some mild loss of dorsiflexion and volar flexion of the wrist compared to the right side.  Films are consistent with a nondisplaced distal radius fracture that appears to be healing Specialty Comments:  No specialty comments available.  Imaging: No results found.   PMFS History: There are no active problems to display for this patient.  Past Medical History:  Diagnosis Date  . Diabetes mellitus ([place])   . Diverticulosis   . Migraines   . Obstructive sleep apnea  pressure is 14  . Rosacea   . Spinal stenosis     Family History  Problem Relation Age of Onset  . Colon cancer Neg Hx     Past Surgical History:  Procedure Laterality Date  . COLONOSCOPY    . KNEE ARTHROSCOPY     right torn meniscus  . SHOULDER ARTHROSCOPY     left torn ligament  . TONSILLECTOMY     Social History   Occupational History  . Not on file  Tobacco Use  . Smoking status: Never Smoker  . Smokeless tobacco: Never Used  Substance and Sexual Activity  . Alcohol use: No    Alcohol/week: 0.0 standard drinks  . Drug use: No  . Sexual activity: Not on file     Garald Balding, MD   Note - This record has been created using [employer].  Chart creation errors have been sought, but may not always  have been located. Such creation errors do not reflect on  the standard of medical care.

## 2018-05-18 DIAGNOSIS — Z923 Personal history of irradiation: Secondary | ICD-10-CM

## 2018-05-18 HISTORY — DX: Personal history of irradiation: Z92.3

## 2018-05-31 ENCOUNTER — Ambulatory Visit
Admission: RE | Admit: 2018-05-31 | Discharge: 2018-05-31 | Disposition: A | Discharge status: Home / Self Care | Payer: Medicare Other | Source: Ambulatory Visit | Attending: Family Medicine | Admitting: Family Medicine

## 2018-05-31 DIAGNOSIS — Z1231 Encounter for screening mammogram for malignant neoplasm of breast: Secondary | ICD-10-CM

## 2018-06-01 ENCOUNTER — Other Ambulatory Visit: Payer: Self-pay | Admitting: Family Medicine

## 2018-06-01 DIAGNOSIS — R928 Other abnormal and inconclusive findings on diagnostic imaging of breast: Secondary | ICD-10-CM

## 2018-06-03 ENCOUNTER — Ambulatory Visit
Admission: RE | Admit: 2018-06-03 | Discharge: 2018-06-03 | Disposition: A | Discharge status: Home / Self Care | Payer: Medicare Other | Source: Ambulatory Visit | Attending: Family Medicine | Admitting: Family Medicine

## 2018-06-03 ENCOUNTER — Other Ambulatory Visit: Payer: Self-pay | Admitting: Family Medicine

## 2018-06-03 DIAGNOSIS — R928 Other abnormal and inconclusive findings on diagnostic imaging of breast: Secondary | ICD-10-CM

## 2018-06-03 DIAGNOSIS — N6320 Unspecified lump in the left breast, unspecified quadrant: Secondary | ICD-10-CM

## 2018-06-08 ENCOUNTER — Ambulatory Visit
Admission: RE | Admit: 2018-06-08 | Discharge: 2018-06-08 | Disposition: A | Discharge status: Home / Self Care | Payer: Medicare Other | Source: Ambulatory Visit | Attending: Family Medicine | Admitting: Family Medicine

## 2018-06-08 DIAGNOSIS — N6320 Unspecified lump in the left breast, unspecified quadrant: Secondary | ICD-10-CM

## 2018-06-10 ENCOUNTER — Telehealth: Payer: Self-pay | Admitting: Hematology and Oncology

## 2018-06-10 NOTE — Telephone Encounter (Signed)
Spoke with patient to confirm afternoon [facility] appointment for 1/29, packet emailed to patient

## 2018-06-13 ENCOUNTER — Encounter: Payer: Self-pay | Admitting: *Deleted

## 2018-06-13 DIAGNOSIS — C50412 Malignant neoplasm of upper-outer quadrant of left female breast: Secondary | ICD-10-CM

## 2018-06-13 DIAGNOSIS — Z170 Estrogen receptor positive status [ER+]: Principal | ICD-10-CM | POA: Insufficient documentation

## 2018-06-14 NOTE — Progress Notes (Signed)
[facility] CONSULT NOTE  Patient Care Team: Katherina Mires, MD as PCP - General (Family Medicine) Fanny Skates, MD as Consulting Physician (General Surgery) Nicholas Lose, MD as Consulting Physician (Hematology and Oncology) Gery Pray, MD as Consulting Physician (Radiation Oncology)  CHIEF COMPLAINTS/PURPOSE OF CONSULTATION: Newly diagnosed breast cancer  HISTORY OF PRESENTING ILLNESS:  Linda Li 73 y.o. female is here because of recent diagnosis of invasive ductal carcinoma of the left breast. The cancer was detected on a routine screening mammogram on 05/31/18 and was not palpable prior to diagnosis. A diagnostic mammogram on 06/03/18 showed a 0.8cm mass at the 12 o'clock position in the left breast. 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   She presents to the clinic today with her husband. She denies any family history of breast cancer. She notes severe hot flashes, and is interested in Effexor. She has a history of osteopenia, with her most recent bone scan on 07/02/15 showing a T-score of -2.1.  I reviewed her records extensively and collaborated the history with the patient.  SUMMARY OF ONCOLOGIC HISTORY:   Malignant neoplasm of upper-outer quadrant of left breast in female, estrogen receptor positive ([place])   06/13/2018 Initial Diagnosis    Screening detected left breast asymmetry with distortion, ultrasound 8 mm at 12 o'clock position, axilla negative, ultrasound-guided biopsy revealed grade 1 IDC with DCIS ER 100%, PR 100%, HER-2 -1+ by IHC, Ki-67 3%, T1BN0 stage Ia clinical stage    MEDICAL HISTORY:  Past Medical History:  Diagnosis Date  . Arthritis   . Diabetes mellitus ([place])   . Diverticulosis   . Hypertension   . Migraines   . Obstructive sleep apnea    pressure is 14  . Rosacea   . Spinal stenosis     SURGICAL HISTORY: Past Surgical History:   Procedure Laterality Date  . COLONOSCOPY    . KNEE ARTHROSCOPY     right torn meniscus  . SHOULDER ARTHROSCOPY     left torn ligament  . TONSILLECTOMY      SOCIAL HISTORY: Social History   Socioeconomic History  . Marital status: Married    Spouse name: Not on file  . Number of children: Not on file  . Years of education: Not on file  . Highest education level: Not on file  Occupational History  . Not on file  Social Needs  . Financial resource strain: Not on file  . Food insecurity:    Worry: Not on file    Inability: Not on file  . Transportation needs:    Medical: Not on file    Non-medical: Not on file  Tobacco Use  . Smoking status: Never Smoker  . Smokeless tobacco: Never Used  Substance and Sexual Activity  . Alcohol use: No    Alcohol/week: 0.0 standard drinks  . Drug use: No  . Sexual activity: Not on file  Lifestyle  . Physical activity:    Days per week: Not on file    Minutes per session: Not on file  . Stress: Not on file  Relationships  . Social connections:    Talks on phone: Not on file    Gets together: Not on file    Attends religious service: Not on file    Active member of club or organization: Not on file    Attends meetings of clubs or organizations: Not on  file    Relationship status: Not on file  . Intimate partner violence:    Fear of current or ex partner: Not on file    Emotionally abused: Not on file    Physically abused: Not on file    Forced sexual activity: Not on file  Other Topics Concern  . Not on file  Social History Narrative  . Not on file    FAMILY HISTORY: Family History  Problem Relation Age of Onset  . Throat cancer Father   . Colon cancer Neg Hx     ALLERGIES:  is allergic to shellfish-derived products.  MEDICATIONS:  Current Outpatient Medications  Medication Sig Dispense Refill  . aspirin EC 81 MG tablet Take 81 mg by mouth daily.    . Calcium Citrate 200 MG TABS Take 500 mg by mouth.    .  Cholecalciferol (VITAMIN D3) 1000 UNITS CAPS Take 2,000 Units by mouth.    . Coenzyme Q10 (COQ10) 100 MG CAPS Take 100 mg by mouth.    . docusate sodium (COLACE) 100 MG capsule Take 100 mg by mouth 2 (two) times daily.     Marland Kitchen glucose blood test strip CHECK BLOOD SUGAR DAILY.    Marland Kitchen lisinopril (PRINIVIL,ZESTRIL) 10 MG tablet Take 20 mg by mouth.     . Magnesium 400 MG CAPS Take by mouth.    . Melatonin 5 MG TABS Take 5 mg by mouth at bedtime.    . metFORMIN (GLUCOPHAGE) 500 MG tablet Take by mouth daily with breakfast.    . OVER THE COUNTER MEDICATION alphalopoeic acid daily    . OVER THE COUNTER MEDICATION Take 1 tablet by mouth at bedtime. 5-HTP Plus (Natrol)    . OVER THE COUNTER MEDICATION Take 1 tablet by mouth at bedtime. Ashwaganda root.    . Red Yeast Rice Extract 600 MG TABS Take by mouth.    . SUMAtriptan (IMITREX) 100 MG tablet 1 po immediately at onset of migraine; may repeat in 2 hrs if HA persists; no more than 2 in 24 hrs    . UNABLE TO FIND Take 2 capsules by mouth daily. CINSULIN    . vitamin C (ASCORBIC ACID) 500 MG tablet Take 500 mg by mouth.    . Azelaic Acid 15 % cream Apply topically.    . fluticasone (FLONASE) 50 MCG/ACT nasal spray Place 1 spray into the nose.    . hydrOXYzine (VISTARIL) 50 MG capsule Take 1 PO by mouth 2 hours prior to procedure, may repeat if needed. 15 capsule 1  . metroNIDAZOLE (METROCREAM) 0.75 % cream Apply topically.    . Omega-3 Fatty Acids (FISH OIL) 1000 MG CAPS Take 1 capsule by mouth.    . venlafaxine XR (EFFEXOR-XR) 37.5 MG 24 hr capsule Take 1 capsule (37.5 mg total) by mouth daily with breakfast. 30 capsule 3   Current Facility-Administered Medications  Medication Dose Route Frequency Provider Last Rate Last Dose  . lidocaine (PF) (XYLOCAINE) 1 % injection 0.3 mL  0.3 mL Other Once Magnus Sinning, MD        REVIEW OF SYSTEMS:   Constitutional: Denies fevers, chills or abnormal night sweats (+) severe hot flashes Eyes: Denies  blurriness of vision, double vision or watery eyes Ears, nose, mouth, throat, and face: Denies mucositis or sore throat Respiratory: Denies cough, dyspnea or wheezes Cardiovascular: Denies palpitation, chest discomfort or lower extremity swelling Gastrointestinal:  Denies nausea, heartburn or change in bowel habits Skin: Denies abnormal skin rashes Lymphatics: Denies new  lymphadenopathy or easy bruising Neurological:Denies numbness, tingling or new weaknesses Behavioral/Psych: Mood is stable, no new changes  Breast: Denies any palpable lumps or discharge All other systems were reviewed with the patient and are negative.  PHYSICAL EXAMINATION: ECOG PERFORMANCE STATUS: 1 - Symptomatic but completely ambulatory  Vitals:   06/15/18 1311  BP: 136/63  Pulse: 75  Resp: 18  Temp: 98 F (36.7 C)  SpO2: 98%   Filed Weights   06/15/18 1311  Weight: 270 lb (122.5 kg)    GENERAL:alert, no distress and comfortable SKIN: skin color, texture, turgor are normal, no rashes or significant lesions EYES: normal, conjunctiva are pink and non-injected, sclera clear OROPHARYNX:no exudate, no erythema and lips, buccal mucosa, and tongue normal  NECK: supple, thyroid normal size, non-tender, without nodularity LYMPH:  no palpable lymphadenopathy in the cervical, axillary or inguinal LUNGS: clear to auscultation and percussion with normal breathing effort HEART: regular rate & rhythm and no murmurs and no lower extremity edema ABDOMEN:abdomen soft, non-tender and normal bowel sounds Musculoskeletal:no cyanosis of digits and no clubbing  PSYCH: alert & oriented x 3 with fluent speech NEURO: no focal motor/sensory deficits BREAST: Left breast bruising from recent biopsies with a small hematoma.  No palpable axillary or supraclavicular lymphadenopathy (exam performed in the presence of a chaperone)   LABORATORY DATA:  I have reviewed the data as listed Lab Results  Component Value Date   WBC 9.4  06/15/2018   HGB 14.9 06/15/2018   HCT 43.2 06/15/2018   MCV 88.0 06/15/2018   PLT 251 06/15/2018   Lab Results  Component Value Date   NA 140 06/15/2018   K 4.4 06/15/2018   CL 104 06/15/2018   CO2 24 06/15/2018    RADIOGRAPHIC STUDIES: I have personally reviewed the radiological reports and agreed with the findings in the report.  ASSESSMENT AND PLAN:  Malignant neoplasm of upper-outer quadrant of left breast in female, estrogen receptor positive ([place]) 06/08/2018:Screening detected left breast asymmetry with distortion, ultrasound 8 mm at 12 o'clock position, axilla negative, ultrasound-guided biopsy revealed grade 1 IDC with DCIS ER 100%, PR 100%, HER-2 -1+ by IHC, Ki-67 3%, T1BN0 stage Ia clinical stage  Pathology and radiology counseling:Discussed with the patient, the details of pathology including the type of breast cancer,the clinical staging, the significance of ER, PR and HER-2/neu receptors and the implications for treatment. After reviewing the pathology in detail, we proceeded to discuss the different treatment options between surgery, radiation, and antiestrogen therapies.  Recommendations: 1. Breast conserving surgery followed by 2. Adjuvant radiation therapy followed by 3. Adjuvant antiestrogen therapy  Return to clinic after surgery to discuss final pathology report    All questions were answered. The patient knows to call the clinic with any problems, questions or concerns.   Nicholas Lose, MD 06/15/2018   I, Cloyde Reams Dorshimer, am acting as scribe for Nicholas Lose, MD.  I have reviewed the above documentation for accuracy and completeness, and I agree with the above.

## 2018-06-15 ENCOUNTER — Inpatient Hospital Stay: Payer: Medicare Other | Attending: Hematology and Oncology | Admitting: Hematology and Oncology

## 2018-06-15 ENCOUNTER — Ambulatory Visit: Payer: Medicare Other | Admitting: Physical Therapy

## 2018-06-15 ENCOUNTER — Inpatient Hospital Stay: Payer: Medicare Other

## 2018-06-15 ENCOUNTER — Encounter: Payer: Self-pay | Admitting: Hematology and Oncology

## 2018-06-15 ENCOUNTER — Other Ambulatory Visit: Payer: Self-pay | Admitting: General Surgery

## 2018-06-15 ENCOUNTER — Ambulatory Visit
Admission: RE | Admit: 2018-06-15 | Discharge: 2018-06-15 | Disposition: A | Discharge status: Home / Self Care | Payer: Medicare Other | Source: Ambulatory Visit | Attending: Radiation Oncology | Admitting: Radiation Oncology

## 2018-06-15 DIAGNOSIS — C50412 Malignant neoplasm of upper-outer quadrant of left female breast: Secondary | ICD-10-CM

## 2018-06-15 DIAGNOSIS — N951 Menopausal and female climacteric states: Secondary | ICD-10-CM

## 2018-06-15 DIAGNOSIS — Z170 Estrogen receptor positive status [ER+]: Principal | ICD-10-CM

## 2018-06-15 DIAGNOSIS — E119 Type 2 diabetes mellitus without complications: Secondary | ICD-10-CM | POA: Diagnosis not present

## 2018-06-15 LAB — CBC WITH DIFFERENTIAL (CANCER CENTER ONLY)
Abs Immature Granulocytes: 0.02 10*3/uL (ref 0.00–0.07)
BASOS PCT: 0 %
Basophils Absolute: 0 10*3/uL (ref 0.0–0.1)
Eosinophils Absolute: 0.2 10*3/uL (ref 0.0–0.5)
Eosinophils Relative: 3 %
HCT: 43.2 % (ref 36.0–46.0)
Hemoglobin: 14.9 g/dL (ref 12.0–15.0)
IMMATURE GRANULOCYTES: 0 %
Lymphocytes Relative: 37 %
Lymphs Abs: 3.5 10*3/uL (ref 0.7–4.0)
MCH: 30.3 pg (ref 26.0–34.0)
MCHC: 34.5 g/dL (ref 30.0–36.0)
MCV: 88 fL (ref 80.0–100.0)
Monocytes Absolute: 0.8 10*3/uL (ref 0.1–1.0)
Monocytes Relative: 8 %
NEUTROS PCT: 52 %
Neutro Abs: 4.9 10*3/uL (ref 1.7–7.7)
PLATELETS: 251 10*3/uL (ref 150–400)
RBC: 4.91 MIL/uL (ref 3.87–5.11)
RDW: 13.2 % (ref 11.5–15.5)
WBC: 9.4 10*3/uL (ref 4.0–10.5)
nRBC: 0 % (ref 0.0–0.2)

## 2018-06-15 LAB — CMP (CANCER CENTER ONLY)
ALK PHOS: 97 U/L (ref 38–126)
ALT: 19 U/L (ref 0–44)
ANION GAP: 12 (ref 5–15)
AST: 14 U/L — ABNORMAL LOW (ref 15–41)
Albumin: 4 g/dL (ref 3.5–5.0)
BILIRUBIN TOTAL: 0.3 mg/dL (ref 0.3–1.2)
BUN: 19 mg/dL (ref 8–23)
CO2: 24 mmol/L (ref 22–32)
Calcium: 10 mg/dL (ref 8.9–10.3)
Chloride: 104 mmol/L (ref 98–111)
Creatinine: 0.75 mg/dL (ref 0.44–1.00)
Glucose, Bld: 132 mg/dL — ABNORMAL HIGH (ref 70–99)
POTASSIUM: 4.4 mmol/L (ref 3.5–5.1)
Sodium: 140 mmol/L (ref 135–145)
TOTAL PROTEIN: 7.5 g/dL (ref 6.5–8.1)

## 2018-06-15 MED ORDER — VENLAFAXINE HCL ER 37.5 MG PO CP24: 37.5000 mg | ORAL_CAPSULE | Freq: Every day | ORAL | 3 refills | Status: DC

## 2018-06-15 NOTE — Progress Notes (Signed)
[facility] Psychosocial Distress Screening Spiritual Care  Met with Linda Li in [facility] to introduce [facility] team/resources, reviewing distress screen per protocol.  The patient scored a 5 on the Psychosocial Distress Thermometer which indicates moderate distress. Also assessed for distress and other psychosocial needs.   ONCBCN DISTRESS SCREENING 06/15/2018  Screening Type Initial Screening  Distress experienced in past week (1-10) 5  Information Concerns Type Lack of info about diagnosis;Lack of info about treatment;Lack of info about complementary therapy choices;Lack of info about maintaining fitness  Physical Problem type Constipation/diarrhea  Referral to support programs Yes   The pt presented to [facility] with her husband. The pt expressed that her distress remains around a 5, which she related to fears around getting surgery. However, the pt expressed that she has experienced some relief from getting additional information about her diagnosis and treatment.   The pt shared that she has several friends and family members that have gone through breast cancer treatment that have been supportive resources to her in the way that they can provide empathy and insight. The pt also reported that she feels well supported by her spouse. The pt shared that utilizes creative activities including playing the dulcimer and quilting to cope with stress. The pt expressed that she is not interested in any support programs at this time, but she reported that she will reach out if any needs arise.  Follow up needed: No.  Doris Cheadle, Counseling Intern [phone]

## 2018-06-15 NOTE — Progress Notes (Signed)
Radiation Oncology         [phone] ________________________________  Multidisciplinary Breast Oncology Clinic [facility]) Initial Outpatient Consultation  Name: Linda Li MRN: [id number]  Date: 06/15/2018  DOB: October 26, 1945  CN:[id number], Jannifer Rodney, MD  Fanny Skates, MD   REFERRING PHYSICIAN: Fanny Skates, MD  DIAGNOSIS: The encounter diagnosis was Malignant neoplasm of upper-outer quadrant of left breast in female, estrogen receptor positive ([place]).  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 Malignant neoplasm of upper-outer quadrant of left breast in female, estrogen receptor positive ([place]) C50.412    Z17.0     HISTORY OF PRESENT ILLNESS::Linda Li is a 73 y.o. female who is presenting to the office today for evaluation of her newly diagnosed breast cancer. She is accompanied by her husband. She is doing well overall.   She had routine screening mammography on January 14 showing a possible abnormality in the left breast. She underwent bilateral diagnostic mammography with tomography and left breast ultrasonography at [facility] on January 17 showing: suspicious 0.8 cm mass in the 12 o'clock position.  Biopsy on January 22 showed: invasive ductal carcinoma as well as DCIS. Prognostic indicators significant for: estrogen receptor, 100% positive and progesterone receptor, 100% positive, both with strong staining intensity. Proliferation marker Ki67 at 3%. HER2 negative.  Menarche: 73 years old Age at first live birth: 73 years old GP: GxP2 LMP: unknown Contraceptive: not used HRT: yes, estrogen with progesterone on/off for several years   The patient was referred today for presentation in the multidisciplinary conference.  Radiology studies and pathology slides were presented there for review and discussion of treatment options.  A consensus was discussed regarding potential next  steps.  PREVIOUS RADIATION THERAPY: No  PAST MEDICAL HISTORY:  has a past medical history of Arthritis, Diabetes mellitus ([place]), Diverticulosis, Hypertension, Migraines, Obstructive sleep apnea, Rosacea, and Spinal stenosis.    PAST SURGICAL HISTORY: Past Surgical History:  Procedure Laterality Date  . COLONOSCOPY    . KNEE ARTHROSCOPY     right torn meniscus  . SHOULDER ARTHROSCOPY     left torn ligament  . TONSILLECTOMY      FAMILY HISTORY: family history includes Throat cancer in her father.  SOCIAL HISTORY:  reports that she has never smoked. She has never used smokeless tobacco. She reports that she does not drink alcohol or use drugs.  ALLERGIES: Shellfish-derived products  MEDICATIONS:  Current Outpatient Medications  Medication Sig Dispense Refill  . aspirin EC 81 MG tablet Take 81 mg by mouth daily.    . Azelaic Acid 15 % cream Apply topically.    . Calcium Citrate 200 MG TABS Take 500 mg by mouth.    . Cholecalciferol (VITAMIN D3) 1000 UNITS CAPS Take 2,000 Units by mouth.    . Coenzyme Q10 (COQ10) 100 MG CAPS Take 100 mg by mouth.    . docusate sodium (COLACE) 100 MG capsule Take 100 mg by mouth 2 (two) times daily.     . fluticasone (FLONASE) 50 MCG/ACT nasal spray Place 1 spray into the nose.    Marland Kitchen glucose blood test strip CHECK BLOOD SUGAR DAILY.    . hydrOXYzine (VISTARIL) 50 MG capsule Take 1 PO by mouth 2 hours prior to procedure, may repeat if needed. 15 capsule 1  . lisinopril (PRINIVIL,ZESTRIL) 10 MG tablet Take 20 mg by mouth.     . Magnesium  400 MG CAPS Take by mouth.    . Melatonin 5 MG TABS Take 5 mg by mouth at bedtime.    . metFORMIN (GLUCOPHAGE) 500 MG tablet Take by mouth daily with breakfast.    . metroNIDAZOLE (METROCREAM) 0.75 % cream Apply topically.    . Omega-3 Fatty Acids (FISH OIL) 1000 MG CAPS Take 1 capsule by mouth.    Marland Kitchen OVER THE COUNTER MEDICATION alphalopoeic acid daily    . OVER THE COUNTER MEDICATION Take 1 tablet by mouth at  bedtime. 5-HTP Plus (Natrol)    . OVER THE COUNTER MEDICATION Take 1 tablet by mouth at bedtime. Ashwaganda root.    . Red Yeast Rice Extract 600 MG TABS Take by mouth.    . SUMAtriptan (IMITREX) 100 MG tablet 1 po immediately at onset of migraine; may repeat in 2 hrs if HA persists; no more than 2 in 24 hrs    . UNABLE TO FIND Take 2 capsules by mouth daily. CINSULIN    . venlafaxine XR (EFFEXOR-XR) 37.5 MG 24 hr capsule Take 1 capsule (37.5 mg total) by mouth daily with breakfast. 30 capsule 3  . vitamin C (ASCORBIC ACID) 500 MG tablet Take 500 mg by mouth.     Current Facility-Administered Medications  Medication Dose Route Frequency Provider Last Rate Last Dose  . lidocaine (PF) (XYLOCAINE) 1 % injection 0.3 mL  0.3 mL Other Once Magnus Sinning, MD        REVIEW OF SYSTEMS:  REVIEW OF SYSTEMS: A 10+ POINT REVIEW OF SYSTEMS WAS OBTAINED including neurology, dermatology, psychiatry, cardiac, respiratory, lymph, extremities, GI, GU, musculoskeletal, constitutional, reproductive, HEENT.On the provided form, she reports night sweats, hearing loss/hearing aid, wears glasses, sleeps on multiple pillows, stress incontinence, back pain, arthritis, walking trouble, headaches, diabetes, and hot flashes. She denies any other symptoms.    PHYSICAL EXAM:  Vitals with BMI 06/15/2018  Height 5' 7.75"  Weight 270 lbs  BMI 37.90  Systolic 240  Diastolic 63  Pulse 75  Respirations 18   Lungs are clear to auscultation bilaterally. Heart has regular rate and rhythm. No palpable cervical, supraclavicular, or axillary adenopathy. Abdomen soft, non-tender, normal bowel sounds. Breast: Right breast with no palpable mass, nipple discharge, or bleeding. Left breast with significant bruising in the upper breast from her recent biopsy. No nipple discharge or bleeding.   ECOG = 1  0 - Asymptomatic (Fully active, able to carry on all predisease activities without restriction)  1 - Symptomatic but completely  ambulatory (Restricted in physically strenuous activity but ambulatory and able to carry out work of a light or sedentary nature. For example, light housework, office work)  2 - Symptomatic, <50% in bed during the day (Ambulatory and capable of all self care but unable to carry out any work activities. Up and about more than 50% of waking hours)  3 - Symptomatic, >50% in bed, but not bedbound (Capable of only limited self-care, confined to bed or chair 50% or more of waking hours)  4 - Bedbound (Completely disabled. Cannot carry on any self-care. Totally confined to bed or chair)  5 - Death   Eustace Pen MM, Creech RH, Tormey DC, et al. [phone]). "Toxicity and response criteria of the [facility]". [place] Oncol. 5 (6): 649-55  LABORATORY DATA:  Lab Results  Component Value Date   WBC 9.4 06/15/2018   HGB 14.9 06/15/2018   HCT 43.2 06/15/2018   MCV 88.0 06/15/2018   PLT 251 06/15/2018  Lab Results  Component Value Date   NA 140 06/15/2018   K 4.4 06/15/2018   CL 104 06/15/2018   CO2 24 06/15/2018   Lab Results  Component Value Date   ALT 19 06/15/2018   AST 14 (L) 06/15/2018   ALKPHOS 97 06/15/2018   BILITOT 0.3 06/15/2018    PULMONARY FUNCTION TEST:   Recent Review Flowsheet Data    There is no flowsheet data to display.      RADIOGRAPHY: US Breast Ltd Uni Left Inc Axilla  Result Date: 06/03/2018 CLINICAL DATA:  73 year old patient recalled from recent screening mammogram for evaluation of possible asymmetry/distortion in the left breast. EXAM: DIGITAL DIAGNOSTIC LEFT MAMMOGRAM WITH TOMO ULTRASOUND LEFT BREAST COMPARISON:  Previous exam(s). ACR Breast Density Category b: There are scattered areas of fibroglandular density. FINDINGS: Spot compression views with tomography confirm a persistent asymmetry with slight distortion in the upper central left breast. There is a coarse benign appearing linear calcification adjacent to the asymmetry. On physical  exam, no mass is palpated in the upper central left breast. Targeted ultrasound is performed, showing an irregular hypoechoic mass with some posterior acoustic shadowing at 12 o'clock position 4 cm from nipple. The mass is estimated to be 0.8 x 0.4 x 0.7 cm. Ultrasound of the left axilla is negative for lymphadenopathy. IMPRESSION: Suspicious 0.8 cm mass 12 o'clock position left breast. Malignancy can not be excluded. RECOMMENDATION: Ultrasound-guided core needle biopsy is recommended and is being scheduled for the patient. I have discussed the findings and recommendations with the patient. Results were also provided in writing at the conclusion of the visit. If applicable, a reminder letter will be sent to the patient regarding the next appointment. BI-RADS CATEGORY  4: Suspicious. Electronically Signed   By: Curlene Dolphin M.D.   On: 06/03/2018 15:54   Mm Diag Breast Tomo Uni Left  Result Date: 06/03/2018 CLINICAL DATA:  73 year old patient recalled from recent screening mammogram for evaluation of possible asymmetry/distortion in the left breast. EXAM: DIGITAL DIAGNOSTIC LEFT MAMMOGRAM WITH TOMO ULTRASOUND LEFT BREAST COMPARISON:  Previous exam(s). ACR Breast Density Category b: There are scattered areas of fibroglandular density. FINDINGS: Spot compression views with tomography confirm a persistent asymmetry with slight distortion in the upper central left breast. There is a coarse benign appearing linear calcification adjacent to the asymmetry. On physical exam, no mass is palpated in the upper central left breast. Targeted ultrasound is performed, showing an irregular hypoechoic mass with some posterior acoustic shadowing at 12 o'clock position 4 cm from nipple. The mass is estimated to be 0.8 x 0.4 x 0.7 cm. Ultrasound of the left axilla is negative for lymphadenopathy. IMPRESSION: Suspicious 0.8 cm mass 12 o'clock position left breast. Malignancy can not be excluded. RECOMMENDATION: Ultrasound-guided core  needle biopsy is recommended and is being scheduled for the patient. I have discussed the findings and recommendations with the patient. Results were also provided in writing at the conclusion of the visit. If applicable, a reminder letter will be sent to the patient regarding the next appointment. BI-RADS CATEGORY  4: Suspicious. Electronically Signed   By: Curlene Dolphin M.D.   On: 06/03/2018 15:54   Mm 3d Screen Breast Bilateral  Result Date: 05/31/2018 CLINICAL DATA:  Screening. EXAM: DIGITAL SCREENING BILATERAL MAMMOGRAM WITH TOMO AND CAD COMPARISON:  Previous exam(s). ACR Breast Density Category b: There are scattered areas of fibroglandular density. FINDINGS: In the left breast, a possible asymmetry/distortion warrants further evaluation. In the right breast, no findings suspicious  for malignancy. Images were processed with CAD. IMPRESSION: Further evaluation is suggested for possible asymmetry/distortion in the left breast. RECOMMENDATION: Diagnostic mammogram and possibly ultrasound of the left breast. (Code:[id number]) The patient will be contacted regarding the findings, and additional imaging will be scheduled. BI-RADS CATEGORY  0: Incomplete. Need additional imaging evaluation and/or prior mammograms for comparison. Electronically Signed   By: Marin Olp M.D.   On: 05/31/2018 16:18   Mm Clip Placement Left  Result Date: 06/08/2018 CLINICAL DATA:  Ultrasound-guided biopsy was performed of a mass in the 12 o'clock position of the left breast. EXAM: DIAGNOSTIC LEFT MAMMOGRAM POST ULTRASOUND BIOPSY COMPARISON:  Previous exam(s). FINDINGS: Mammographic images were obtained following ultrasound guided biopsy of left breast mass at 12 o'clock. The ribbon shaped biopsy clip is satisfactorily positioned. Due to post biopsy changes, the mass is not well visualized on the post clip mammogram, but the clip appears appropriately positioned when compared to recent diagnostic mammogram. IMPRESSION:  Satisfactory position of ribbon shaped biopsy clip. Final Assessment: Post Procedure Mammograms for Marker Placement Electronically Signed   By: Curlene Dolphin M.D.   On: 06/08/2018 08:27   [place] Lt Breast Bx W Loc Dev 1st Lesion Img Bx Spec US Guide  Addendum Date: 06/09/2018   ADDENDUM REPORT: 06/09/2018 13:13 ADDENDUM: Pathology revealed GRADE I INVASIVE DUCTAL CARCINOMA, DUCTAL CARCINOMA IN SITU of the Left breast, 12 o'clock. This was found to be concordant by Dr. Curlene Dolphin. Pathology results were discussed with the patient by telephone. The patient reported doing well after the biopsy with tenderness and bruising at the site. Post biopsy instructions and care were reviewed and questions were answered. The patient was encouraged to call [facility] for any additional concerns. The patient was referred to [facility] on June 15, 2018. Pathology results reported by Terie Purser, RN on 06/09/2018. Electronically Signed   By: Curlene Dolphin M.D.   On: 06/09/2018 13:13   Result Date: 06/09/2018 CLINICAL DATA:  Ultrasound-guided core needle biopsy is recommended of a mass in the 12 o'clock position of the left breast. EXAM: ULTRASOUND GUIDED LEFT BREAST CORE NEEDLE BIOPSY COMPARISON:  Previous exam(s). FINDINGS: I met with the patient and we discussed the procedure of ultrasound-guided biopsy, including benefits and alternatives. We discussed the high likelihood of a successful procedure. We discussed the risks of the procedure, including infection, bleeding, tissue injury, clip migration, and inadequate sampling. Informed written consent was given. The usual time-out protocol was performed immediately prior to the procedure. Lesion quadrant: Upper inner quadrant Using sterile technique and 1% Lidocaine as local anesthetic, under direct ultrasound visualization, a 12 gauge spring-loaded device was used to  perform biopsy of mass at 12 o'clock position using a lateral approach. At the conclusion of the procedure a ribbon tissue marker clip was deployed into the biopsy cavity. Follow up 2 view mammogram was performed and dictated separately. IMPRESSION: Ultrasound guided biopsy of the left breast. No apparent complications. Electronically Signed: By: Curlene Dolphin M.D. On: 06/08/2018 08:29      IMPRESSION: Stage IA (cT1b, cN0) Invasive Ductal Carcinoma with DCIS   Patient will be a good candidate for breast conservation with a lumpectomy.  given the tumor characteristics and the fact that she is over 70, if she agrees to adjuvant hormonal therapy then she may be able to avoid radiation treatment. Given the good prognosis and small lesion, sentinel node procedure was not recommended.  I discussed with the patient that we have to await her final pathologic findings before making a final determination. Patient and her husband are planning a big trip soon after her surgery, and so she will proceed with adjuvant hormonal therapy soon after her surgery. If radiation therapy is indicated, she will proceed with this therapy after she returns from her 2 month trip.    PLAN:  1. Left lumpectomy 2. +/-External radiation therapy 3. AI   ------------------------------------------------  Blair Promise, PhD, MD This document serves as a record of services personally performed by Gery Pray, MD. It was created on his behalf by Mary-Margaret Loma Messing, a trained medical scribe. The creation of this record is based on the scribe's personal observations and the provider's statements to them. This document has been checked and approved by the attending provider.

## 2018-06-15 NOTE — Progress Notes (Signed)
Nutrition Assessment  Reason for Assessment:  Pt seen in Breast Clinic  ASSESSMENT:   73 year old female with new diagnosis of breast cancer.  Past medical history of DM, HTN.    Patient reports normal appetite  Medications:  Reviewed, mutiple supplements noted  Labs: reviewed  Anthropometrics:   Height: 67 inches Weight: 270 lb BMI: 41   NUTRITION DIAGNOSIS: Food and nutrition related knowledge deficit related to new diagnosis of breast cancer as evidenced by no prior need for nutrition related information.  INTERVENTION:   Discussed and provided packet of information regarding nutritional tips for breast cancer patients.   Discussed evidenced and recommendations regarding diet supplementation.  Recommend MVI unless otherwise recommended by MD.  Patient will discuss supplementation with MD.   Questions answered.  Teachback method used.  Contact information provided and patient knows to contact me with questions/concerns.    MONITORING, EVALUATION, and GOAL: Pt will consume a healthy plant based diet to maintain lean body mass throughout treatment.   Linda Li, [place], [place] Registered Dietitian [phone] (pager)

## 2018-06-15 NOTE — Assessment & Plan Note (Signed)
06/08/2018:Screening detected left breast asymmetry with distortion, ultrasound 8 mm at 12 o'clock position, axilla negative, ultrasound-guided biopsy revealed grade 1 IDC with DCIS ER 100%, PR 100%, HER-2 -1+ by IHC, Ki-67 3%, T1BN0 stage Ia clinical stage  Pathology and radiology counseling:Discussed with the patient, the details of pathology including the type of breast cancer,the clinical staging, the significance of ER, PR and HER-2/neu receptors and the implications for treatment. After reviewing the pathology in detail, we proceeded to discuss the different treatment options between surgery, radiation, and antiestrogen therapies.  Recommendations: 1. Breast conserving surgery followed by 2. Adjuvant radiation therapy followed by 3. Adjuvant antiestrogen therapy  Return to clinic after surgery to discuss final pathology report

## 2018-06-16 ENCOUNTER — Telehealth: Payer: Self-pay | Admitting: *Deleted

## 2018-06-16 ENCOUNTER — Telehealth: Payer: Self-pay | Admitting: Hematology and Oncology

## 2018-06-16 ENCOUNTER — Other Ambulatory Visit: Payer: Self-pay | Admitting: General Surgery

## 2018-06-16 DIAGNOSIS — C50412 Malignant neoplasm of upper-outer quadrant of left female breast: Secondary | ICD-10-CM

## 2018-06-16 DIAGNOSIS — Z170 Estrogen receptor positive status [ER+]: Principal | ICD-10-CM

## 2018-06-16 NOTE — Telephone Encounter (Signed)
Spoke to pt concerning Linda Li from 1.29.20. Denies questions or concerns regarding dx or treatment care plan. Scheduled pt for post op with Dr. Lindi Adie on 07/15/18 at 9:30. Encourage pt to call with needs. Received verbal understanding.

## 2018-06-16 NOTE — Telephone Encounter (Signed)
No los °

## 2018-06-18 DIAGNOSIS — C50919 Malignant neoplasm of unspecified site of unspecified female breast: Secondary | ICD-10-CM

## 2018-06-18 HISTORY — DX: Malignant neoplasm of unspecified site of unspecified female breast: C50.919

## 2018-06-22 ENCOUNTER — Other Ambulatory Visit: Payer: Self-pay

## 2018-06-22 MED ORDER — ESCITALOPRAM OXALATE 10 MG PO TABS: 10.0000 mg | ORAL_TABLET | Freq: Every day | ORAL | 1 refills | Status: DC

## 2018-06-22 NOTE — Progress Notes (Signed)
Pt called to request lexapro instead of effexor for her hot flashes. Pt has used this in the past and have tolerated well. Per Dr.Gudena, okay to substitute lexapro for effexor. Will send new escript to pharmacy.

## 2018-06-28 ENCOUNTER — Telehealth: Payer: Self-pay | Admitting: Hematology and Oncology

## 2018-06-28 NOTE — Telephone Encounter (Signed)
[facility] CME 2/28 - moved f/u to 2/27. Left message. Schedule mailed.

## 2018-06-30 ENCOUNTER — Other Ambulatory Visit: Payer: Self-pay

## 2018-06-30 ENCOUNTER — Encounter (HOSPITAL_BASED_OUTPATIENT_CLINIC_OR_DEPARTMENT_OTHER): Payer: Self-pay | Admitting: *Deleted

## 2018-07-03 NOTE — H&P (Signed)
Linda Li Location: [facility] Patient #: [id number] DOB: 10-30-1945 Undefined / Language: Cleophus Molt / Race: White Female        History of Present Illness      . This is a 73 year old female who was referred to the M.D. see by Dr. Curlene Dolphin at the [facility] for management of new left breast cancer. Suzanna Obey is her PCP. Dr. Lindi Adie and Dr. Sondra Come are involved in her care. Her husband was present with her throughout the encounter     She has no prior history of breast problems. Gets annual screening mammograms. Recently the showed an 8 mm mass in the 12 o'clock position of the left breast, 4 cm from the nipple. Axillary ultrasound negative. 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 ER and PR strongly positive at 100%. Ki-67 30%. HER-2 negative.      Past history significant for non-insulin-dependent diabetes. Sleep apnea. Spinal stenosis for which she receives steroid injection. Elevated BMI. Family history negative for breast, ovarian, or prostate cancer. Mother died age 52. Father died age 63 This history reveals she is married. 2 sons that are growing. Her husband is with his in a wheelchair. Retired Animator. She denies alcohol. The both were tied. They had a big trip out [place] in early March which may interfere with her adjuvant therapies but that will be discussed     We talked about breast conservation and compared that to mastectomy. She is motivated for breast conservation and we think she is an excellent candidate for that. Because of her age and the low-grade nature of her tumor we do not think she would benefit from sentinel node biopsy. She will therefore be scheduled for a left breast lumpectomy with radioactive seed localization. I discussed the indications, details, techniques, and risks of the surgery with him in detail. The risk of bleeding, infection, reoperation for positive margins, nerve damage with  chronic pain, cosmetic deformity, and other unforeseen problems. They understand all these issues well. All her questions are answered. They agree with this plan      Past Surgical History  Breast Biopsy  Left. Knee Surgery  Right. Shoulder Surgery  Left. Tonsillectomy   Diagnostic Studies History  Mammogram  within last year Pap Smear  1-5 years ago  Medication History  Medications Reconciled  Social History  Caffeine use  Coffee. Illicit drug use  Remotely quit drug use. No alcohol use  Tobacco use  Never smoker.  Family History  Arthritis  Family Members In General, Father, Mother. Heart Disease  Father. Melanoma  Father. Migraine Headache  Father. Respiratory Condition  Mother.  Pregnancy / Birth History  Age of menopause  68-55 Gravida  5 Irregular periods  Length (months) of breastfeeding  >17 Maternal age  22-35 Para  2  Other Problems  Arthritis  Bladder Problems  Depression  Diabetes Mellitus  High blood pressure  Migraine Headache  Sleep Apnea     Review of Systems  General Not Present- Appetite Loss, Chills, Fatigue, Fever, Night Sweats, Weight Gain and Weight Loss. HEENT Present- Seasonal Allergies. Not Present- Earache, Hearing Loss, Hoarseness, Nose Bleed, Oral Ulcers, Ringing in the Ears, Sinus Pain, Sore Throat, Visual Disturbances, Wears glasses/contact lenses and Yellow Eyes. Respiratory Not Present- Bloody sputum, Chronic Cough, Difficulty Breathing, Snoring and Wheezing. Breast Not Present- Breast Mass, Breast Pain, Nipple Discharge and Skin Changes. Cardiovascular Not Present- Chest Pain, Difficulty Breathing Lying Down, Leg Cramps, Palpitations, Rapid Heart  Rate, Shortness of Breath and Swelling of Extremities. Gastrointestinal Not Present- Abdominal Pain, Bloating, Bloody Stool, Change in Bowel Habits, Chronic diarrhea, Constipation, Difficulty Swallowing, Excessive gas, Gets full quickly at meals,  Hemorrhoids, Indigestion, Nausea, Rectal Pain and Vomiting. Female Genitourinary Not Present- Frequency, Nocturia, Painful Urination, Pelvic Pain and Urgency. Neurological Not Present- Decreased Memory, Fainting, Headaches, Numbness, Seizures, Tingling, Tremor, Trouble walking and Weakness. Hematology Not Present- Blood Thinners, Easy Bruising, Excessive bleeding, Gland problems, HIV and Persistent Infections.   Physical Exam  General Mental Status-Alert. General Appearance-Consistent with stated age. Hydration-Well hydrated. Voice-Normal. Note: Elevated BMI. Could be as high as 40.   Head and Neck Head-normocephalic, atraumatic with no lesions or palpable masses. Trachea-midline. Thyroid Gland Characteristics - normal size and consistency.  Eye Eyeball - Bilateral-Extraocular movements intact. Sclera/Conjunctiva - Bilateral-No scleral icterus.  Chest and Lung Exam Chest and lung exam reveals -quiet, even and easy respiratory effort with no use of accessory muscles and on auscultation, normal breath sounds, no adventitious sounds and normal vocal resonance. Inspection Chest Wall - Normal. Back - normal.  Breast Note: Medium-sized. Bruise at 12 o'clock position left breast and no significant hematoma. No other skin change or mass in either breast. No palpable axillary adenopathy.   Cardiovascular Cardiovascular examination reveals -normal heart sounds, regular rate and rhythm with no murmurs and normal pedal pulses bilaterally.  Abdomen Inspection Inspection of the abdomen reveals - No Hernias. Skin - Scar - no surgical scars. Palpation/Percussion Palpation and Percussion of the abdomen reveal - Soft, Non Tender, No Rebound tenderness, No Rigidity (guarding) and No hepatosplenomegaly. Auscultation Auscultation of the abdomen reveals - Bowel sounds normal.  Neurologic Neurologic evaluation reveals -alert and oriented x 3 with no impairment of recent  or remote memory. Mental Status-Normal.  Musculoskeletal Normal Exam - Left-Upper Extremity Strength Normal and Lower Extremity Strength Normal. Normal Exam - Right-Upper Extremity Strength Normal and Lower Extremity Strength Normal.  Lymphatic Head & Neck  General Head & Neck Lymphatics: Bilateral - Description - Normal. Axillary  General Axillary Region: Bilateral - Description - Normal. Tenderness - Non Tender. Femoral & Inguinal  Generalized Femoral & Inguinal Lymphatics: Bilateral - Description - Normal. Tenderness - Non Tender.    Assessment & Plan  PRIMARY CANCER OF UPPER OUTER QUADRANT OF LEFT FEMALE BREAST (C50.412)   Recent imaging studies and biopsy show a malignant mass in the left breast at the 12 o'clock position Specifically they identified an 8 mm mass superiorly in the left breast 4 cm from the nipple Ultrasound of the axilla was negative Biopsy shows a low-grade invasive ductal carcinoma. Estrogen and progesterone receptors are strongly positive. HER-2 negative. Proliferation index low at 3%  The next step in your care is surgery The options include lumpectomy and postoperative radiation therapy or mastectomy There is no survival advantage to mastectomy you are motivated for breast conservation and I think you an excellent candidate for that  You will be scheduled for left breast lumpectomy with radioactive seed localization We do not think you would benefit from any lymph node surgery, which is good  Dr. Darrel Hoover office will call you to schedule the surgery  Dr. Lindi Adie and Dr. 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 Dalbert Batman, M.D., [facility] General and Minimally invasive Surgery Breast and Colorectal  Surgery Office:   [phone] Pager:   [phone]

## 2018-07-04 ENCOUNTER — Encounter (HOSPITAL_BASED_OUTPATIENT_CLINIC_OR_DEPARTMENT_OTHER)
Admission: RE | Admit: 2018-07-04 | Discharge: 2018-07-04 | Disposition: A | Discharge status: Home / Self Care | Payer: Medicare Other | Source: Ambulatory Visit | Attending: General Surgery | Admitting: General Surgery

## 2018-07-04 ENCOUNTER — Other Ambulatory Visit: Payer: Self-pay

## 2018-07-04 DIAGNOSIS — Z01810 Encounter for preprocedural cardiovascular examination: Secondary | ICD-10-CM | POA: Diagnosis not present

## 2018-07-04 NOTE — Progress Notes (Signed)
Reviewed EKG with Dr. Lanetta Inch.  She okay'd for surgery as scheduled.

## 2018-07-04 NOTE — Progress Notes (Signed)
Patient given pre-surgery drink with instructions to drink at 0730am DOS.  Patient given pre-surgical soap with instructions to use 1/2 the night before surgery and 1/2 the DOS.  Patient verbalized understanding of both.   EKG performed.

## 2018-07-06 ENCOUNTER — Ambulatory Visit
Admission: RE | Admit: 2018-07-06 | Discharge: 2018-07-06 | Disposition: A | Discharge status: Home / Self Care | Payer: Medicare Other | Source: Ambulatory Visit | Attending: General Surgery | Admitting: General Surgery

## 2018-07-06 DIAGNOSIS — Z170 Estrogen receptor positive status [ER+]: Principal | ICD-10-CM

## 2018-07-06 DIAGNOSIS — C50412 Malignant neoplasm of upper-outer quadrant of left female breast: Secondary | ICD-10-CM

## 2018-07-08 ENCOUNTER — Encounter (HOSPITAL_BASED_OUTPATIENT_CLINIC_OR_DEPARTMENT_OTHER)
Admission: RE | Disposition: A | Discharge status: Home / Self Care | Payer: Self-pay | Source: Home / Self Care | Attending: General Surgery

## 2018-07-08 ENCOUNTER — Ambulatory Visit (HOSPITAL_BASED_OUTPATIENT_CLINIC_OR_DEPARTMENT_OTHER): Payer: Medicare Other | Admitting: Anesthesiology

## 2018-07-08 ENCOUNTER — Encounter (HOSPITAL_BASED_OUTPATIENT_CLINIC_OR_DEPARTMENT_OTHER): Payer: Self-pay | Admitting: Anesthesiology

## 2018-07-08 ENCOUNTER — Ambulatory Visit (HOSPITAL_BASED_OUTPATIENT_CLINIC_OR_DEPARTMENT_OTHER)
Admission: RE | Admit: 2018-07-08 | Discharge: 2018-07-08 | Disposition: A | Discharge status: Home / Self Care | Payer: Medicare Other | Attending: General Surgery | Admitting: General Surgery

## 2018-07-08 ENCOUNTER — Other Ambulatory Visit: Payer: Self-pay

## 2018-07-08 ENCOUNTER — Ambulatory Visit
Admission: RE | Admit: 2018-07-08 | Discharge: 2018-07-08 | Disposition: A | Discharge status: Home / Self Care | Payer: Medicare Other | Source: Ambulatory Visit | Attending: General Surgery | Admitting: General Surgery

## 2018-07-08 DIAGNOSIS — E119 Type 2 diabetes mellitus without complications: Secondary | ICD-10-CM | POA: Diagnosis not present

## 2018-07-08 DIAGNOSIS — G4730 Sleep apnea, unspecified: Secondary | ICD-10-CM | POA: Diagnosis not present

## 2018-07-08 DIAGNOSIS — C50812 Malignant neoplasm of overlapping sites of left female breast: Secondary | ICD-10-CM | POA: Diagnosis not present

## 2018-07-08 DIAGNOSIS — Z170 Estrogen receptor positive status [ER+]: Principal | ICD-10-CM

## 2018-07-08 DIAGNOSIS — F329 Major depressive disorder, single episode, unspecified: Secondary | ICD-10-CM | POA: Diagnosis not present

## 2018-07-08 DIAGNOSIS — M48061 Spinal stenosis, lumbar region without neurogenic claudication: Secondary | ICD-10-CM | POA: Diagnosis not present

## 2018-07-08 DIAGNOSIS — C50412 Malignant neoplasm of upper-outer quadrant of left female breast: Secondary | ICD-10-CM

## 2018-07-08 DIAGNOSIS — R030 Elevated blood-pressure reading, without diagnosis of hypertension: Secondary | ICD-10-CM | POA: Insufficient documentation

## 2018-07-08 HISTORY — PX: BREAST LUMPECTOMY: SHX2

## 2018-07-08 HISTORY — PX: BREAST LUMPECTOMY WITH RADIOACTIVE SEED LOCALIZATION: SHX6424

## 2018-07-08 HISTORY — DX: Malignant neoplasm of unspecified site of unspecified female breast: C50.919

## 2018-07-08 LAB — GLUCOSE, CAPILLARY
Glucose-Capillary: 120 mg/dL — ABNORMAL HIGH (ref 70–99)
Glucose-Capillary: 132 mg/dL — ABNORMAL HIGH (ref 70–99)

## 2018-07-08 SURGERY — BREAST LUMPECTOMY WITH RADIOACTIVE SEED LOCALIZATION
Anesthesia: General | Site: Breast | Laterality: Left

## 2018-07-08 MED ORDER — FENTANYL CITRATE (PF) 100 MCG/2ML IJ SOLN
50.0000 ug | INTRAMUSCULAR | Status: DC | PRN
  Administered 2018-07-08 (×2): 50 ug via INTRAVENOUS

## 2018-07-08 MED ORDER — PROPOFOL 10 MG/ML IV BOLUS
INTRAVENOUS | Status: DC | PRN
  Administered 2018-07-08: 160 mg via INTRAVENOUS

## 2018-07-08 MED ORDER — OXYCODONE HCL 5 MG/5ML PO SOLN: 5.0000 mg | Freq: Once | ORAL | Status: AC | PRN

## 2018-07-08 MED ORDER — ACETAMINOPHEN 160 MG/5ML PO SOLN: 1000.0000 mg | Freq: Once | ORAL | Status: DC | PRN

## 2018-07-08 MED ORDER — OXYCODONE HCL 5 MG PO TABS
5.0000 mg | ORAL_TABLET | Freq: Once | ORAL | Status: AC | PRN
  Administered 2018-07-08: 5 mg via ORAL

## 2018-07-08 MED ORDER — SCOPOLAMINE 1 MG/3DAYS TD PT72: 1.0000 | MEDICATED_PATCH | Freq: Once | TRANSDERMAL | Status: DC | PRN

## 2018-07-08 MED ORDER — CELECOXIB 200 MG PO CAPS
ORAL_CAPSULE | ORAL | Status: AC
  Filled 2018-07-08: qty 1

## 2018-07-08 MED ORDER — HYDROCODONE-ACETAMINOPHEN 5-325 MG PO TABS: 1.0000 | ORAL_TABLET | Freq: Four times a day (QID) | ORAL | 0 refills | Status: DC | PRN

## 2018-07-08 MED ORDER — EPHEDRINE SULFATE 50 MG/ML IJ SOLN
INTRAMUSCULAR | Status: DC | PRN
  Administered 2018-07-08: 10 mg via INTRAVENOUS

## 2018-07-08 MED ORDER — CHLORHEXIDINE GLUCONATE CLOTH 2 % EX PADS: 6.0000 | MEDICATED_PAD | Freq: Once | CUTANEOUS | Status: DC

## 2018-07-08 MED ORDER — ACETAMINOPHEN 500 MG PO TABS
ORAL_TABLET | ORAL | Status: AC
  Filled 2018-07-08: qty 2

## 2018-07-08 MED ORDER — OXYCODONE HCL 5 MG PO TABS
ORAL_TABLET | ORAL | Status: AC
  Filled 2018-07-08: qty 1

## 2018-07-08 MED ORDER — DEXTROSE 5 % IV SOLN
3.0000 g | INTRAVENOUS | Status: AC
  Administered 2018-07-08: 3 g via INTRAVENOUS

## 2018-07-08 MED ORDER — BUPIVACAINE HCL (PF) 0.5 % IJ SOLN
INTRAMUSCULAR | Status: AC
  Filled 2018-07-08: qty 30

## 2018-07-08 MED ORDER — ACETAMINOPHEN 500 MG PO TABS: 1000.0000 mg | ORAL_TABLET | Freq: Once | ORAL | Status: DC | PRN

## 2018-07-08 MED ORDER — CELECOXIB 200 MG PO CAPS
200.0000 mg | ORAL_CAPSULE | ORAL | Status: AC
  Administered 2018-07-08: 200 mg via ORAL

## 2018-07-08 MED ORDER — CEFAZOLIN SODIUM-DEXTROSE 2-4 GM/100ML-% IV SOLN
INTRAVENOUS | Status: AC
  Filled 2018-07-08: qty 100

## 2018-07-08 MED ORDER — LACTATED RINGERS IV SOLN
INTRAVENOUS | Status: DC
  Administered 2018-07-08: 09:00:00 via INTRAVENOUS

## 2018-07-08 MED ORDER — DEXAMETHASONE SODIUM PHOSPHATE 4 MG/ML IJ SOLN
INTRAMUSCULAR | Status: DC | PRN
  Administered 2018-07-08: 4 mg via INTRAVENOUS

## 2018-07-08 MED ORDER — ACETAMINOPHEN 500 MG PO TABS
1000.0000 mg | ORAL_TABLET | ORAL | Status: AC
  Administered 2018-07-08: 1000 mg via ORAL

## 2018-07-08 MED ORDER — FENTANYL CITRATE (PF) 100 MCG/2ML IJ SOLN: 25.0000 ug | INTRAMUSCULAR | Status: DC | PRN

## 2018-07-08 MED ORDER — FENTANYL CITRATE (PF) 100 MCG/2ML IJ SOLN
INTRAMUSCULAR | Status: AC
  Filled 2018-07-08: qty 2

## 2018-07-08 MED ORDER — LIDOCAINE 2% (20 MG/ML) 5 ML SYRINGE
INTRAMUSCULAR | Status: DC | PRN
  Administered 2018-07-08: 40 mg via INTRAVENOUS

## 2018-07-08 MED ORDER — 0.9 % SODIUM CHLORIDE (POUR BTL) OPTIME
TOPICAL | Status: DC | PRN
  Administered 2018-07-08: 500 mL

## 2018-07-08 MED ORDER — ONDANSETRON HCL 4 MG/2ML IJ SOLN
INTRAMUSCULAR | Status: AC
  Filled 2018-07-08: qty 2

## 2018-07-08 MED ORDER — BUPIVACAINE-EPINEPHRINE (PF) 0.25% -1:200000 IJ SOLN
INTRAMUSCULAR | Status: DC | PRN
  Administered 2018-07-08: 10 mL

## 2018-07-08 MED ORDER — GABAPENTIN 300 MG PO CAPS
300.0000 mg | ORAL_CAPSULE | ORAL | Status: AC
  Administered 2018-07-08: 300 mg via ORAL

## 2018-07-08 MED ORDER — ACETAMINOPHEN 10 MG/ML IV SOLN: 1000.0000 mg | Freq: Once | INTRAVENOUS | Status: DC | PRN

## 2018-07-08 MED ORDER — LIDOCAINE 2% (20 MG/ML) 5 ML SYRINGE
INTRAMUSCULAR | Status: AC
  Filled 2018-07-08: qty 5

## 2018-07-08 MED ORDER — ACETAMINOPHEN 500 MG PO TABS
ORAL_TABLET | ORAL | Status: AC
  Filled 2018-07-08: qty 1

## 2018-07-08 MED ORDER — MIDAZOLAM HCL 2 MG/2ML IJ SOLN
INTRAMUSCULAR | Status: AC
  Filled 2018-07-08: qty 2

## 2018-07-08 MED ORDER — BUPIVACAINE-EPINEPHRINE (PF) 0.25% -1:200000 IJ SOLN
INTRAMUSCULAR | Status: AC
  Filled 2018-07-08: qty 30

## 2018-07-08 MED ORDER — CEFAZOLIN SODIUM-DEXTROSE 1-4 GM/50ML-% IV SOLN
INTRAVENOUS | Status: AC
  Filled 2018-07-08: qty 50

## 2018-07-08 MED ORDER — ONDANSETRON HCL 4 MG/2ML IJ SOLN
INTRAMUSCULAR | Status: DC | PRN
  Administered 2018-07-08: 4 mg via INTRAVENOUS

## 2018-07-08 MED ORDER — MIDAZOLAM HCL 2 MG/2ML IJ SOLN
1.0000 mg | INTRAMUSCULAR | Status: DC | PRN
  Administered 2018-07-08: 2 mg via INTRAVENOUS

## 2018-07-08 MED ORDER — PROPOFOL 500 MG/50ML IV EMUL
INTRAVENOUS | Status: AC
  Filled 2018-07-08: qty 50

## 2018-07-08 MED ORDER — DEXAMETHASONE SODIUM PHOSPHATE 10 MG/ML IJ SOLN
INTRAMUSCULAR | Status: AC
  Filled 2018-07-08: qty 1

## 2018-07-08 MED ORDER — GABAPENTIN 300 MG PO CAPS
ORAL_CAPSULE | ORAL | Status: AC
  Filled 2018-07-08: qty 1

## 2018-07-08 SURGICAL SUPPLY — 70 items
ADH SKN CLS APL DERMABOND .7 (GAUZE/BANDAGES/DRESSINGS) ×1
APL SKNCLS STERI-STRIP NONHPOA (GAUZE/BANDAGES/DRESSINGS)
APPLIER CLIP 9.375 MED OPEN (MISCELLANEOUS)
APR CLP MED 9.3 20 MLT OPN (MISCELLANEOUS)
BENZOIN TINCTURE PRP APPL 2/3 (GAUZE/BANDAGES/DRESSINGS) IMPLANT
BINDER BREAST LRG (GAUZE/BANDAGES/DRESSINGS) IMPLANT
BINDER BREAST MEDIUM (GAUZE/BANDAGES/DRESSINGS) IMPLANT
BINDER BREAST XLRG (GAUZE/BANDAGES/DRESSINGS) IMPLANT
BINDER BREAST XXLRG (GAUZE/BANDAGES/DRESSINGS) ×2 IMPLANT
BLADE HEX COATED 2.75 (ELECTRODE) ×3 IMPLANT
BLADE SURG 10 STRL SS (BLADE) IMPLANT
BLADE SURG 15 STRL LF DISP TIS (BLADE) ×1 IMPLANT
BLADE SURG 15 STRL SS (BLADE) ×3
CANISTER SUC SOCK COL 7IN (MISCELLANEOUS) IMPLANT
CANISTER SUCT 1200ML W/VALVE (MISCELLANEOUS) ×3 IMPLANT
CHLORAPREP W/TINT 26ML (MISCELLANEOUS) ×5 IMPLANT
CLIP APPLIE 9.375 MED OPEN (MISCELLANEOUS) IMPLANT
CLOSURE WOUND 1/2 X4 (GAUZE/BANDAGES/DRESSINGS)
COVER BACK TABLE 60X90IN (DRAPES) ×3 IMPLANT
COVER MAYO STAND STRL (DRAPES) ×3 IMPLANT
COVER PROBE W GEL 5X96 (DRAPES) ×3 IMPLANT
COVER WAND RF STERILE (DRAPES) IMPLANT
DECANTER SPIKE VIAL GLASS SM (MISCELLANEOUS) IMPLANT
DERMABOND ADVANCED (GAUZE/BANDAGES/DRESSINGS) ×2
DERMABOND ADVANCED .7 DNX12 (GAUZE/BANDAGES/DRESSINGS) ×1 IMPLANT
DRAPE LAPAROSCOPIC ABDOMINAL (DRAPES) ×3 IMPLANT
DRAPE UTILITY XL STRL (DRAPES) ×5 IMPLANT
DRSG PAD ABDOMINAL 8X10 ST (GAUZE/BANDAGES/DRESSINGS) ×3 IMPLANT
ELECT REM PT RETURN 9FT ADLT (ELECTROSURGICAL) ×3
ELECTRODE REM PT RTRN 9FT ADLT (ELECTROSURGICAL) ×1 IMPLANT
GAUZE SPONGE 4X4 12PLY STRL LF (GAUZE/BANDAGES/DRESSINGS) ×3 IMPLANT
GLOVE BIOGEL PI IND STRL 7.0 (GLOVE) IMPLANT
GLOVE BIOGEL PI IND STRL 8 (GLOVE) IMPLANT
GLOVE BIOGEL PI INDICATOR 7.0 (GLOVE) ×2
GLOVE BIOGEL PI INDICATOR 8 (GLOVE) ×2
GLOVE EUDERMIC 7 POWDERFREE (GLOVE) ×5 IMPLANT
GLOVE SURG SYN 8.0 (GLOVE) ×3 IMPLANT
GLOVE SURG SYN 8.0 PF PI (GLOVE) IMPLANT
GOWN STRL REIN XL XLG (GOWN DISPOSABLE) ×2 IMPLANT
GOWN STRL REUS W/ TWL LRG LVL3 (GOWN DISPOSABLE) ×1 IMPLANT
GOWN STRL REUS W/ TWL XL LVL3 (GOWN DISPOSABLE) ×1 IMPLANT
GOWN STRL REUS W/TWL LRG LVL3 (GOWN DISPOSABLE)
GOWN STRL REUS W/TWL XL LVL3 (GOWN DISPOSABLE) ×3
ILLUMINATOR WAVEGUIDE N/F (MISCELLANEOUS) IMPLANT
KIT MARKER MARGIN INK (KITS) ×3 IMPLANT
LIGHT WAVEGUIDE WIDE FLAT (MISCELLANEOUS) IMPLANT
NDL HYPO 25X1 1.5 SAFETY (NEEDLE) ×1 IMPLANT
NEEDLE HYPO 25X1 1.5 SAFETY (NEEDLE) ×3 IMPLANT
NS IRRIG 1000ML POUR BTL (IV SOLUTION) ×3 IMPLANT
PACK BASIN DAY SURGERY FS (CUSTOM PROCEDURE TRAY) ×3 IMPLANT
PENCIL BUTTON HOLSTER BLD 10FT (ELECTRODE) ×3 IMPLANT
SHEET MEDIUM DRAPE 40X70 STRL (DRAPES) IMPLANT
SLEEVE SCD COMPRESS KNEE MED (MISCELLANEOUS) ×3 IMPLANT
SPONGE LAP 18X18 RF (DISPOSABLE) ×2 IMPLANT
SPONGE LAP 4X18 RFD (DISPOSABLE) ×1 IMPLANT
STRIP CLOSURE SKIN 1/2X4 (GAUZE/BANDAGES/DRESSINGS) IMPLANT
SUT ETHILON 3 0 FSL (SUTURE) IMPLANT
SUT MNCRL AB 4-0 PS2 18 (SUTURE) ×3 IMPLANT
SUT SILK 2 0 SH (SUTURE) ×3 IMPLANT
SUT VIC AB 2-0 CT1 27 (SUTURE)
SUT VIC AB 2-0 CT1 TAPERPNT 27 (SUTURE) IMPLANT
SUT VIC AB 3-0 SH 27 (SUTURE)
SUT VIC AB 3-0 SH 27X BRD (SUTURE) IMPLANT
SUT VICRYL 3-0 CR8 SH (SUTURE) ×3 IMPLANT
SYR 10ML LL (SYRINGE) ×3 IMPLANT
TOWEL GREEN STERILE FF (TOWEL DISPOSABLE) ×3 IMPLANT
TRAY FAXITRON CT DISP (TRAY / TRAY PROCEDURE) ×3 IMPLANT
TUBE CONNECTING 20'X1/4 (TUBING) ×1
TUBE CONNECTING 20X1/4 (TUBING) ×2 IMPLANT
YANKAUER SUCT BULB TIP NO VENT (SUCTIONS) ×3 IMPLANT

## 2018-07-08 NOTE — Discharge Instructions (Signed)
Pain med given at 11:23 am.   NO TYLENOL BEFORE  3 PM TODAY!        [place] Office Phone Number [phone]  BREAST BIOPSY/ PARTIAL MASTECTOMY: POST OP INSTRUCTIONS  Always review your discharge instruction sheet given to you by the facility where your surgery was performed.  IF YOU HAVE DISABILITY OR FAMILY LEAVE FORMS, YOU MUST BRING THEM TO THE OFFICE FOR PROCESSING.  DO NOT GIVE THEM TO YOUR DOCTOR.  1. A prescription for pain medication may be given to you upon discharge.  Take your pain medication as prescribed, if needed.  If narcotic pain medicine is not needed, then you may take acetaminophen (Tylenol) or ibuprofen (Advil) as needed. 2. Take your usually prescribed medications unless otherwise directed 3. If you need a refill on your pain medication, please contact your pharmacy.  They will contact our office to request authorization.  Prescriptions will not be filled after 5pm or on week-ends. 4. You should eat very light the first 24 hours after surgery, such as soup, crackers, pudding, etc.  Resume your normal diet the day after surgery. 5. Most patients will experience some swelling and bruising in the breast.  Ice packs and a good support bra will help.  Swelling and bruising can take several days to resolve.  6. It is common to experience some constipation if taking pain medication after surgery.  Increasing fluid intake and taking a stool softener will usually help or prevent this problem from occurring.  A mild laxative (Milk of Magnesia or Miralax) should be taken according to package directions if there are no bowel movements after 48 hours. 7. Unless discharge instructions indicate otherwise, you may remove your bandages 24-48 hours after surgery, and you may shower at that time.  You may have steri-strips (small skin tapes) in place directly over the incision.  These strips should be left on the skin for 7-10 days.  If your surgeon used skin glue on  the incision, you may shower in 24 hours.  The glue will flake off over the next 2-3 weeks.  Any sutures or staples will be removed at the office during your follow-up visit. 8. ACTIVITIES:  You may resume regular daily activities (gradually increasing) beginning the next day.  Wearing a good support bra or sports bra minimizes pain and swelling.  You may have sexual intercourse when it is comfortable. a. You may drive when you no longer are taking prescription pain medication, you can comfortably wear a seatbelt, and you can safely maneuver your car and apply brakes. b. RETURN TO WORK:  ______________________________________________________________________________________ 9. You should see your doctor in the office for a follow-up appointment approximately two weeks after your surgery.  Your doctors nurse will typically make your follow-up appointment when she calls you with your pathology report.  Expect your pathology report 2-3 business days after your surgery.  You may call to check if you do not hear from [place] after three days. 10. OTHER INSTRUCTIONS: _______________________________________________________________________________________________ _____________________________________________________________________________________________________________________________________ _____________________________________________________________________________________________________________________________________ _____________________________________________________________________________________________________________________________________  WHEN TO CALL YOUR DOCTOR: 1. Fever over 101.0 2. Nausea and/or vomiting. 3. Extreme swelling or bruising. 4. Continued bleeding from incision. 5. Increased pain, redness, or drainage from the incision.  The clinic staff is available to answer your questions during regular business hours.  Please dont hesitate to call and ask to speak to one of the nurses for  clinical concerns.  If you have a medical emergency, go to the nearest emergency room or call 911.  A surgeon from [facility] is always on call at the hospital.  For further questions, please visit [email or web address] [employer] Office Phone Number [phone]  BREAST BIOPSY/ PARTIAL MASTECTOMY: POST OP INSTRUCTIONS  Always review your discharge instruction sheet given to you by the facility where your surgery was performed.  IF YOU HAVE DISABILITY OR FAMILY LEAVE FORMS, YOU MUST BRING THEM TO THE OFFICE FOR PROCESSING.  DO NOT GIVE THEM TO YOUR DOCTOR.  11. A prescription for pain medication may be given to you upon discharge.  Take your pain medication as prescribed, if needed.  If narcotic pain medicine is not needed, then you may take acetaminophen (Tylenol) or ibuprofen (Advil) as needed. 12. Take your usually prescribed medications unless otherwise directed 13. If you need a refill on your pain medication, please contact your pharmacy.  They will contact our office to request authorization.  Prescriptions will not be filled after 5pm or on week-ends. 14. You should eat very light the first 24 hours after surgery, such as soup, crackers, pudding, etc.  Resume your normal diet the day after surgery. 15. Most patients will experience some swelling and bruising in the breast.  Ice packs and a good support bra will help.  Swelling and bruising can take several days to resolve.  16. It is common to experience some constipation if taking pain medication after surgery.  Increasing fluid intake and taking a stool softener will usually help or prevent this problem from occurring.  A mild laxative (Milk of Magnesia or Miralax) should be taken according to package directions if there are no bowel movements after 48 hours. 17. Unless discharge instructions indicate otherwise, you may remove your bandages 24-48 hours after surgery, and you may shower at that time.  You  may have steri-strips (small skin tapes) in place directly over the incision.  These strips should be left on the skin for 7-10 days.  If your surgeon used skin glue on the incision, you may shower in 24 hours.  The glue will flake off over the next 2-3 weeks.  Any sutures or staples will be removed at the office during your follow-up visit. 18. ACTIVITIES:  You may resume regular daily activities (gradually increasing) beginning the next day.  Wearing a good support bra or sports bra minimizes pain and swelling.  You may have sexual intercourse when it is comfortable. a. You may drive when you no longer are taking prescription pain medication, you can comfortably wear a seatbelt, and you can safely maneuver your car and apply brakes. b. RETURN TO WORK:  ______________________________________________________________________________________ 19. You should see your doctor in the office for a follow-up appointment approximately two weeks after your surgery.  Your doctors nurse will typically make your follow-up appointment when she calls you with your pathology report.  Expect your pathology report 2-3 business days after your surgery.  You may call to check if you do not hear from [place] after three days. 20. OTHER INSTRUCTIONS: _______________________________________________________________________________________________ _____________________________________________________________________________________________________________________________________ _____________________________________________________________________________________________________________________________________ _____________________________________________________________________________________________________________________________________  WHEN TO CALL YOUR DOCTOR: 6. Fever over 101.0 7. Nausea and/or vomiting. 8. Extreme swelling or bruising. 9. Continued bleeding from incision. 10. Increased pain, redness, or drainage from the  incision.  The clinic staff is available to answer your questions during regular business hours.  Please dont hesitate to call and ask to speak to one of the nurses for clinical concerns.  If you have a medical emergency, go to the nearest emergency room or call 911.  A  surgeon from [facility] is always on call at the hospital.  For further questions, please visit [email or web address]              Managing Your Pain After Surgery Without Opioids    Thank you for participating in our program to help patients manage their pain after surgery without opioids. This is part of our effort to provide you with the best care possible, without exposing you or your family to the risk that opioids pose.  What pain can I expect after surgery? You can expect to have some pain after surgery. This is normal. The pain is typically worse the day after surgery, and quickly begins to get better. Many studies have found that many patients are able to manage their pain after surgery with Over-the-Counter (OTC) medications such as Tylenol and Motrin. If you have a condition that does not allow you to take Tylenol or Motrin, notify your surgical team.  How will I manage my pain? The best strategy for controlling your pain after surgery is around the clock pain control with Tylenol (acetaminophen) and Motrin (ibuprofen or Advil). Alternating these medications with each other allows you to maximize your pain control. In addition to Tylenol and Motrin, you can use heating pads or ice packs on your incisions to help reduce your pain.  How will I alternate your regular strength over-the-counter pain medication? You will take a dose of pain medication every three hours. ; Start by taking 650 mg of Tylenol (2 pills of 325 mg) ; 3 hours later take 600 mg of Motrin (3 pills of 200 mg) ; 3 hours after taking the Motrin take 650 mg of Tylenol ; 3 hours after that take 600 mg of  Motrin.   - 1 -  See example - if your first dose of Tylenol is at 12:00 PM   12:00 PM Tylenol 650 mg (2 pills of 325 mg)  3:00 PM Motrin 600 mg (3 pills of 200 mg)  6:00 PM Tylenol 650 mg (2 pills of 325 mg)  9:00 PM Motrin 600 mg (3 pills of 200 mg)  Continue alternating every 3 hours   We recommend that you follow this schedule around-the-clock for at least 3 days after surgery, or until you feel that it is no longer needed. Use the table on the last page of this handout to keep track of the medications you are taking. Important: Do not take more than 3000mg  of Tylenol or 3200mg  of Motrin in a 24-hour period. Do not take ibuprofen/Motrin if you have a history of bleeding stomach ulcers, severe kidney disease, &/or actively taking a blood thinner  What if I still have pain? If you have pain that is not controlled with the over-the-counter pain medications (Tylenol and Motrin or Advil) you might have what we call breakthrough pain. You will receive a prescription for a small amount of an opioid pain medication such as Oxycodone, Tramadol, or Tylenol with Codeine. Use these opioid pills in the first 24 hours after surgery if you have breakthrough pain. Do not take more than 1 pill every 4-6 hours.  If you still have uncontrolled pain after using all opioid pills, don't hesitate to call our staff using the number provided. We will help make sure you are managing your pain in the best way possible, and if necessary, we can provide a prescription for additional pain medication.   Day 1    Time  Name of Medication Number of pills  taken  Amount of Acetaminophen  Pain Level   Comments  AM PM       AM PM       AM PM       AM PM       AM PM       AM PM       AM PM       AM PM       Total Daily amount of Acetaminophen Do not take more than  3,000 mg per day      Day 2    Time  Name of Medication Number of pills taken  Amount of Acetaminophen  Pain Level   Comments  AM  PM       AM PM       AM PM       AM PM       AM PM       AM PM       AM PM       AM PM       Total Daily amount of Acetaminophen Do not take more than  3,000 mg per day      Day 3    Time  Name of Medication Number of pills taken  Amount of Acetaminophen  Pain Level   Comments  AM PM       AM PM       AM PM       AM PM          AM PM       AM PM       AM PM       AM PM       Total Daily amount of Acetaminophen Do not take more than  3,000 mg per day      Day 4    Time  Name of Medication Number of pills taken  Amount of Acetaminophen  Pain Level   Comments  AM PM       AM PM       AM PM       AM PM       AM PM       AM PM       AM PM       AM PM       Total Daily amount of Acetaminophen Do not take more than  3,000 mg per day      Day 5    Time  Name of Medication Number of pills taken  Amount of Acetaminophen  Pain Level   Comments  AM PM       AM PM       AM PM       AM PM       AM PM       AM PM       AM PM       AM PM       Total Daily amount of Acetaminophen Do not take more than  3,000 mg per day       Day 6    Time  Name of Medication Number of pills taken  Amount of Acetaminophen  Pain Level  Comments  AM PM       AM PM       AM PM       AM PM       AM PM  AM PM       AM PM       AM PM       Total Daily amount of Acetaminophen Do not take more than  3,000 mg per day      Day 7    Time  Name of Medication Number of pills taken  Amount of Acetaminophen  Pain Level   Comments  AM PM       AM PM       AM PM       AM PM       AM PM       AM PM       AM PM       AM PM       Total Daily amount of Acetaminophen Do not take more than  3,000 mg per day        For additional information about how and where to safely dispose of unused opioid medications - [employer]  Disclaimer: This document contains information and/or instructional materials adapted from [place]  for the typical patient with your condition. It does not replace medical advice from your health care provider because your experience may differ from that of the typical patient. Talk to your health care provider if you have any questions about this document, your condition or your treatment plan. Adapted from [place] Instructions  Activity: Get plenty of rest for the remainder of the day. A responsible individual must stay with you for 24 hours following the procedure.  For the next 24 hours, DO NOT: -Drive a car -Paediatric nurse -Drink alcoholic beverages -Take any medication unless instructed by your physician -Make any legal decisions or sign important papers.  Meals: Start with liquid foods such as gelatin or soup. Progress to regular foods as tolerated. Avoid greasy, spicy, heavy foods. If nausea and/or vomiting occur, drink only clear liquids until the nausea and/or vomiting subsides. Call your physician if vomiting continues.  Special Instructions/Symptoms: Your throat may feel dry or sore from the anesthesia or the breathing tube placed in your throat during surgery. If this causes discomfort, gargle with warm salt water. The discomfort should disappear within 24 hours.  If you had a scopolamine patch placed behind your ear for the management of post- operative nausea and/or vomiting:  1. The medication in the patch is effective for 72 hours, after which it should be removed.  Wrap patch in a tissue and discard in the trash. Wash hands thoroughly with soap and water. 2. You may remove the patch earlier than 72 hours if you experience unpleasant side effects which may include dry mouth, dizziness or visual disturbances. 3. Avoid touching the patch. Wash your hands with soap and water after contact with the patch.

## 2018-07-08 NOTE — Anesthesia Preprocedure Evaluation (Signed)
Anesthesia Evaluation  Patient identified by MRN, date of birth, ID band Patient awake    Reviewed: Allergy & Precautions, NPO status , Patient's Chart, lab work & pertinent test results  History of Anesthesia Complications Negative for: history of anesthetic complications  Airway Mallampati: II  TM Distance: >3 FB Neck ROM: Full    Dental  (+) Teeth Intact   Pulmonary sleep apnea and Continuous Positive Airway Pressure Ventilation ,    breath sounds clear to auscultation       Cardiovascular hypertension, Pt. on medications (-) angina(-) Past MI and (-) CHF  Rhythm:Regular     Neuro/Psych  Headaches, negative psych ROS   GI/Hepatic negative GI ROS,   Endo/Other  diabetes  Renal/GU negative Renal ROS     Musculoskeletal  (+) Arthritis ,   Abdominal   Peds  Hematology   Anesthesia Other Findings   Reproductive/Obstetrics                             Anesthesia Physical Anesthesia Plan  ASA: II  Anesthesia Plan: General   Post-op Pain Management:    Induction: Intravenous  PONV Risk Score and Plan: 3 and Ondansetron and Dexamethasone  Airway Management Planned: LMA  Additional Equipment: None  Intra-op Plan:   Post-operative Plan: Extubation in OR  Informed Consent: I have reviewed the patients History and Physical, chart, labs and discussed the procedure including the risks, benefits and alternatives for the proposed anesthesia with the patient or authorized representative who has indicated his/her understanding and acceptance.     Dental advisory given  Plan Discussed with: CRNA and Surgeon  Anesthesia Plan Comments:         Anesthesia Quick Evaluation

## 2018-07-08 NOTE — Anesthesia Procedure Notes (Signed)
Procedure Name: LMA Insertion Performed by: Amarie Tarte W, CRNA Pre-anesthesia Checklist: Patient identified, Emergency Drugs available, Suction available and Patient being monitored Patient Re-evaluated:Patient Re-evaluated prior to induction Oxygen Delivery Method: Circle system utilized Preoxygenation: Pre-oxygenation with 100% oxygen Induction Type: IV induction Ventilation: Mask ventilation without difficulty LMA: LMA inserted LMA Size: 4.0 Number of attempts: 1 Placement Confirmation: positive ETCO2 Tube secured with: Tape Dental Injury: Teeth and Oropharynx as per pre-operative assessment        

## 2018-07-08 NOTE — Transfer of Care (Signed)
Immediate Anesthesia Transfer of Care Note  Patient: Linda Li  Procedure(s) Performed: LEFT BREAST LUMPECTOMY WITH RADIOACTIVE SEED LOCALIZATION (Left Breast)  Patient Location: PACU  Anesthesia Type:General  Level of Consciousness: awake and sedated  Airway & Oxygen Therapy: Patient Spontanous Breathing and Patient connected to face mask oxygen  Post-op Assessment: Report given to RN and Post -op Vital signs reviewed and stable  Post vital signs: Reviewed and stable  Last Vitals:  Vitals Value Taken Time  BP 126/64 07/08/2018 10:45 AM  Temp    Pulse 80 07/08/2018 10:46 AM  Resp 12 07/08/2018 10:46 AM  SpO2 98 % 07/08/2018 10:46 AM  Vitals shown include unvalidated device data.  Last Pain:  Vitals:   07/08/18 0850  TempSrc: Oral  PainSc: 0-No pain         Complications: No apparent anesthesia complications

## 2018-07-08 NOTE — Interval H&P Note (Signed)
History and Physical Interval Note:  07/08/2018 9:01 AM  Linda Li  has presented today for surgery, with the diagnosis of left breast cancer  The various methods of treatment have been discussed with the patient and family. After consideration of risks, benefits and other options for treatment, the patient has consented to  Procedure(s): LEFT BREAST LUMPECTOMY WITH RADIOACTIVE SEED LOCALIZATION (Left) as a surgical intervention .  The patient's history has been reviewed, patient examined, no change in status, stable for surgery.  I have reviewed the patient's chart and labs.  Questions were answered to the patient's satisfaction.     Adin Hector

## 2018-07-08 NOTE — Op Note (Signed)
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 Dalbert Batman, M.D., FACS  Assistant:                      OR staff  Operative Indications:   . This is a 73 year old female who was referred to the [facility] by Dr. Curlene Dolphin at the [facility] for management of new left breast cancer. Suzanna Obey is her PCP. Dr. Lindi Adie and Dr. Sondra Come are involved in her care.      She has no prior history of breast problems. Gets annual screening mammograms. Recently the showed an 8 mm mass in the 12 o'clock position of the left breast, 4 cm from the nipple. Axillary ultrasound negative. 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 ER and PR strongly positive at 100%. Ki-67 30%. HER-2 negative.. Family history negative for breast, ovarian, or prostate cancer.      We talked about breast conservation and compared that to mastectomy. She is motivated for breast conservation and we think she is an excellent candidate for that. Because of her age and the low-grade nature of her tumor we do not think she would benefit from sentinel node biopsy. She will therefore be scheduled for a left breast lumpectomy with radioactive seed localization.  Operative Findings:       The marker clip and radioactive seed were in the 12 o'clock position of the left breast, about 5 cm superior and deep to the superior areolar margin.  The seed and clip were very close to one another.  The specimen mammogram looked good containing the seed and the marker clip.  3D Faxitron image shows these to be in the center of the specimen and not close to any margin.  The anterior margin is essentially the dermis.  Procedure in  Detail:          Following the induction of general LMA anesthesia the patient's left breast was prepped and draped in a sterile fashion.  Surgical timeout was performed.  Intravenous antibiotics were given.  0.25% Marcaine with epinephrine was used as a local infiltration anesthetic.    Using the neoprobe I isolated the radioactive seed signal at the 12 o'clock position.  A transverse curvilinear incision was made in the skin lines.  Lumpectomy was performed using electrocautery and the neoprobe.  The specimen was removed and marked with a 6 color ink kit and silk sutures to orient the pathologist.  Specimen mammogram looked very good as described above.  The specimen was sent to the lab where the seed was retrieved.  Hemostasis was excellent and achieved with electrocautery.  The wound was irrigated.  I placed 5 metal marker clips in the walls of the lumpectomy cavity.  The breast tissues were closed in 2 separate layers with interrupted 3-0 Vicryl and the skin closed with a running subcuticular 4-0 Monocryl and Dermabond.  Clean bandages and a  breast binder were placed.  The patient tolerated the procedure well and was taken to PACU in stable condition.  EBL 20 cc or less.  Counts correct.  Complications none.    Addendum: I logged onto the PMP aware website and reviewed her prescription medication history     Siona Coulston M. Dalbert Batman, M.D., FACS General and Minimally Invasive Surgery Breast and Colorectal Surgery  07/08/2018 10:45 AM

## 2018-07-11 ENCOUNTER — Encounter (HOSPITAL_BASED_OUTPATIENT_CLINIC_OR_DEPARTMENT_OTHER): Payer: Self-pay | Admitting: General Surgery

## 2018-07-11 NOTE — Progress Notes (Signed)
Inform patient of Pathology report,. Left breast pathology shows: Known Invasive ductal Carcinoma;  1.3 cm.;   Widely negative margins. No further surgery required. I will discuss in detail at next O.V. Let me know you reached her.  hmi

## 2018-07-12 NOTE — Anesthesia Postprocedure Evaluation (Signed)
Anesthesia Post Note  Patient: Linda Li  Procedure(s) Performed: LEFT BREAST LUMPECTOMY WITH RADIOACTIVE SEED LOCALIZATION (Left Breast)     Patient location during evaluation: PACU Anesthesia Type: General Level of consciousness: awake and alert Pain management: pain level controlled Vital Signs Assessment: post-procedure vital signs reviewed and stable Respiratory status: spontaneous breathing, nonlabored ventilation, respiratory function stable and patient connected to nasal cannula oxygen Cardiovascular status: blood pressure returned to baseline and stable Postop Assessment: no apparent nausea or vomiting Anesthetic complications: no    Last Vitals:  Vitals:   07/08/18 1115 07/08/18 1151  BP: 123/79 (!) 160/67  Pulse: 70 75  Resp: 15   Temp:  36.5 C  SpO2: 93% 99%    Last Pain:  Vitals:   07/08/18 1151  TempSrc: Oral  PainSc: 1                  Euell Schiff

## 2018-07-13 NOTE — Progress Notes (Signed)
Patient Care Team: Katherina Mires, MD as PCP - General (Family Medicine) Fanny Skates, MD as Consulting Physician (General Surgery) Nicholas Lose, MD as Consulting Physician (Hematology and Oncology) Gery Pray, MD as Consulting Physician (Radiation Oncology)  DIAGNOSIS:    ICD-10-CM   1. Malignant neoplasm of upper-outer quadrant of left breast in female, estrogen receptor positive ([place]) C50.412    Z17.0     SUMMARY OF ONCOLOGIC HISTORY:   Malignant neoplasm of upper-outer quadrant of left breast in female, estrogen receptor positive ([place])   06/13/2018 Initial Diagnosis    Screening detected left breast asymmetry with distortion, ultrasound 8 mm at 12 o'clock position, axilla negative, ultrasound-guided biopsy revealed grade 1 IDC with DCIS ER 100%, PR 100%, HER-2 -1+ by IHC, Ki-67 3%, T1BN0 stage Ia clinical stage    07/08/2018 Surgery    Left lumpectomy: Grade 2 IDC, 1.3 cm, margins negative, negative for lymphovascular or perineural invasion, ER 100%, PR 90%, HER-2 -1+, Ki-67 3%, T1CNX stage Ia      CHIEF COMPLIANT: Follow-up s/p lumpectomy to review pathology  INTERVAL HISTORY: Linda Li is a 73 y.o. with above-mentioned history of left breast cancer who underwent a mastectomy on 07/08/18 for which pathology confirmed 1.3cm grade 2 IDC, HER2 negative, ER/PR 100%, Ki67 3%, with negative margins and no lymph node involvement. She presents to the clinic today with her husband and is recovering well from surgery. She denies any breast pain. She is planning a trip from 3/12-3/15. She has noticed a decrease in severity of her hot flashes since starting Lexapro. She reviewed her medication list with me.  REVIEW OF SYSTEMS:   Constitutional: Denies fevers, chills or abnormal weight loss (+) hot flashes Eyes: Denies blurriness of vision Ears, nose, mouth, throat, and face: Denies mucositis or sore throat Respiratory: Denies cough, dyspnea or wheezes Cardiovascular: Denies  palpitation, chest discomfort Gastrointestinal: Denies nausea, heartburn or change in bowel habits Skin: Denies abnormal skin rashes Lymphatics: Denies new lymphadenopathy or easy bruising Neurological: Denies numbness, tingling or new weaknesses Behavioral/Psych: Mood is stable, no new changes  Extremities: No lower extremity edema Breast: denies any pain or lumps or nodules in either breasts All other systems were reviewed with the patient and are negative.  I have reviewed the past medical history, past surgical history, social history and family history with the patient and they are unchanged from previous note.  ALLERGIES:  is allergic to shellfish-derived products.  MEDICATIONS:  Current Outpatient Medications  Medication Sig Dispense Refill  . aspirin EC 81 MG tablet Take 81 mg by mouth daily.    . Azelaic Acid 15 % cream Apply topically.    . Calcium Citrate 200 MG TABS Take 500 mg by mouth.    . Cholecalciferol (VITAMIN D3) 1000 UNITS CAPS Take 2,000 Units by mouth.    . Coenzyme Q10 (COQ10) 100 MG CAPS Take 100 mg by mouth.    . docusate sodium (COLACE) 100 MG capsule Take 100 mg by mouth 2 (two) times daily.     Marland Kitchen escitalopram (LEXAPRO) 10 MG tablet Take 1 tablet (10 mg total) by mouth daily. 30 tablet 1  . fluticasone (FLONASE) 50 MCG/ACT nasal spray Place 1 spray into the nose.    Marland Kitchen glucose blood test strip CHECK BLOOD SUGAR DAILY.    . hydrOXYzine (VISTARIL) 50 MG capsule Take 1 PO by mouth 2 hours prior to procedure, may repeat if needed. 15 capsule 1  . lisinopril (PRINIVIL,ZESTRIL) 10 MG tablet Take  20 mg by mouth.     . Magnesium 400 MG CAPS Take by mouth.    . Melatonin 5 MG TABS Take 5 mg by mouth at bedtime.    . metFORMIN (GLUCOPHAGE) 500 MG tablet Take by mouth daily with breakfast.    . metroNIDAZOLE (METROCREAM) 0.75 % cream Apply topically.    . Omega-3 Fatty Acids (FISH OIL) 1000 MG CAPS Take 1 capsule by mouth.    Marland Kitchen OVER THE COUNTER MEDICATION alphalopoeic  acid daily    . OVER THE COUNTER MEDICATION Take 1 tablet by mouth at bedtime. 5-HTP Plus (Natrol)    . OVER THE COUNTER MEDICATION Take 1 tablet by mouth at bedtime. Ashwaganda root.    . SUMAtriptan (IMITREX) 100 MG tablet 1 po immediately at onset of migraine; may repeat in 2 hrs if HA persists; no more than 2 in 24 hrs    . UNABLE TO FIND Take 2 capsules by mouth daily. CINSULIN    . vitamin C (ASCORBIC ACID) 500 MG tablet Take 500 mg by mouth.     Current Facility-Administered Medications  Medication Dose Route Frequency Provider Last Rate Last Dose  . lidocaine (PF) (XYLOCAINE) 1 % injection 0.3 mL  0.3 mL Other Once Magnus Sinning, MD        PHYSICAL EXAMINATION: ECOG PERFORMANCE STATUS: 1 - Symptomatic but completely ambulatory  Vitals:   07/14/18 1131  BP: (!) 142/67  Pulse: 71  Resp: 17  Temp: 97.6 F (36.4 C)  SpO2: 96%   Filed Weights   07/14/18 1131  Weight: 270 lb 14.4 oz (122.9 kg)    GENERAL: alert, no distress and comfortable SKIN: skin color, texture, turgor are normal, no rashes or significant lesions EYES: normal, Conjunctiva are pink and non-injected, sclera clear OROPHARYNX: no exudate, no erythema and lips, buccal mucosa, and tongue normal  NECK: supple, thyroid normal size, non-tender, without nodularity LYMPH: no palpable lymphadenopathy in the cervical, axillary or inguinal LUNGS: clear to auscultation and percussion with normal breathing effort HEART: regular rate & rhythm and no murmurs and no lower extremity edema ABDOMEN: abdomen soft, non-tender and normal bowel sounds MUSCULOSKELETAL: no cyanosis of digits and no clubbing  NEURO: alert & oriented x 3 with fluent speech, no focal motor/sensory deficits EXTREMITIES: No lower extremity edema  LABORATORY DATA:  I have reviewed the data as listed CMP Latest Ref Rng & Units 06/15/2018  Glucose 70 - 99 mg/dL 132(H)  BUN 8 - 23 mg/dL 19  Creatinine 0.44 - 1.00 mg/dL 0.75  Sodium 135 - 145  mmol/L 140  Potassium 3.5 - 5.1 mmol/L 4.4  Chloride 98 - 111 mmol/L 104  CO2 22 - 32 mmol/L 24  Calcium 8.9 - 10.3 mg/dL 10.0  Total Protein 6.5 - 8.1 g/dL 7.5  Total Bilirubin 0.3 - 1.2 mg/dL 0.3  Alkaline Phos 38 - 126 U/L 97  AST 15 - 41 U/L 14(L)  ALT 0 - 44 U/L 19    Lab Results  Component Value Date   WBC 9.4 06/15/2018   HGB 14.9 06/15/2018   HCT 43.2 06/15/2018   MCV 88.0 06/15/2018   PLT 251 06/15/2018   NEUTROABS 4.9 06/15/2018    ASSESSMENT & PLAN:  Malignant neoplasm of upper-outer quadrant of left breast in female, estrogen receptor positive ([place]) 07/08/2018: Left lumpectomy: Grade 2 IDC, 1.3 cm, margins negative, negative for lymphovascular or perineural invasion, ER 100%, PR 90%, HER-2 -1+, Ki-67 3%, T1CNX stage Ia  Pathology counseling: I discussed  the final pathology report of the patient provided  a copy of this report. I discussed the margins . We also discussed the final staging along with previously performed ER/PR and HER-2/neu testing.  Recommendation: Based upon the fact that it is grade 2 and more than 1 cm, I recommended Oncotype DX testing. 1.  Oncotype DX testing 2.  Adjuvant radiation 3.  Followed by adjuvant antiestrogen therapy  Return to clinic based upon Oncotype DX test result      No orders of the defined types were placed in this encounter.  The patient has a good understanding of the overall plan. she agrees with it. she will call with any problems that may develop before the next visit here.  Nicholas Lose, MD 07/14/2018  Julious Oka Dorshimer am acting as scribe for Dr. Nicholas Lose.  I have reviewed the above documentation for accuracy and completeness, and I agree with the above.

## 2018-07-14 ENCOUNTER — Inpatient Hospital Stay: Payer: Medicare Other | Attending: Hematology and Oncology | Admitting: Hematology and Oncology

## 2018-07-14 DIAGNOSIS — Z7982 Long term (current) use of aspirin: Secondary | ICD-10-CM | POA: Insufficient documentation

## 2018-07-14 DIAGNOSIS — C50412 Malignant neoplasm of upper-outer quadrant of left female breast: Secondary | ICD-10-CM | POA: Insufficient documentation

## 2018-07-14 DIAGNOSIS — Z7984 Long term (current) use of oral hypoglycemic drugs: Secondary | ICD-10-CM

## 2018-07-14 DIAGNOSIS — Z79899 Other long term (current) drug therapy: Secondary | ICD-10-CM

## 2018-07-14 DIAGNOSIS — Z170 Estrogen receptor positive status [ER+]: Secondary | ICD-10-CM

## 2018-07-14 NOTE — Assessment & Plan Note (Signed)
07/08/2018: Left lumpectomy: Grade 2 IDC, 1.3 cm, margins negative, negative for lymphovascular or perineural invasion, ER 100%, PR 90%, HER-2 -1+, Ki-67 3%, T1CNX stage Ia  Pathology counseling: I discussed the final pathology report of the patient provided  a copy of this report. I discussed the margins . We also discussed the final staging along with previously performed ER/PR and HER-2/neu testing.  Recommendation: Based upon the fact that it is grade 2 and more than 1 cm, I recommended Oncotype DX testing. 1.  Oncotype DX testing 2.  Adjuvant radiation 3.  Followed by adjuvant antiestrogen therapy  Return to clinic based upon Oncotype DX test result

## 2018-07-15 ENCOUNTER — Ambulatory Visit: Payer: Medicare Other | Admitting: Hematology and Oncology

## 2018-07-16 ENCOUNTER — Other Ambulatory Visit: Payer: Self-pay | Admitting: Hematology and Oncology

## 2018-07-18 ENCOUNTER — Telehealth: Payer: Self-pay | Admitting: *Deleted

## 2018-07-18 NOTE — Telephone Encounter (Signed)
Ordered oncotype per Dr. Gudena.  Faxed requisition to pathology and confirmed receipt. 

## 2018-07-28 ENCOUNTER — Telehealth: Payer: Self-pay | Admitting: *Deleted

## 2018-07-28 DIAGNOSIS — C50412 Malignant neoplasm of upper-outer quadrant of left female breast: Secondary | ICD-10-CM

## 2018-07-28 DIAGNOSIS — Z170 Estrogen receptor positive status [ER+]: Principal | ICD-10-CM

## 2018-07-28 NOTE — Telephone Encounter (Signed)
Received Oncotype score of 4/3%. Physician team notified. Called pt with results and discussed does not need chemotherapy based on these results. Informed referral has been made for Dr. Sondra Come to discuss xrt. Received verbal understanding. Denies further needs.

## 2018-08-04 ENCOUNTER — Encounter (HOSPITAL_COMMUNITY): Payer: Self-pay | Admitting: Hematology and Oncology

## 2018-08-19 NOTE — Progress Notes (Signed)
Location of Breast Cancer: Malignant neoplasm of upper-outer quadrant of left breast in female, estrogen receptor positive ([place])  Histology per Pathology Report: 07/08/18:  Diagnosis Breast, lumpectomy, Left with seed - INVASIVE DUCTAL CARCINOMA, GRADE II, 1.3 CM. - RESECTION MARGINS ARE NEGATIVE FOR CARCINOMA (GREATER THAN 1 CM). - NEGATIVE FOR LYMPHOVASCULAR OR PERINEURAL INVASION. - BIOPSY SITE CHANGES Tumor Size: 1.3 cm. Estrogen Receptor: 100%, strong staining intensity. Progesterone Receptor: 100%, strong staining intensity. HER2: Negative (1+). Ki-67: 3%.   Did patient present with symptoms (if so, please note symptoms) or was this found on screening mammography?:   Screening detected left breast asymmetry with distortion, ultrasound 8 mm at 12 o'clock position, axilla negative, ultrasound-guided biopsy revealed grade 1 IDC with DCIS ER 100%, PR 100%, HER-2 -1+ by IHC, Ki-67 3%, T1BN0 stage Ia clinical stage     Past/Anticipated interventions by surgeon, if any: 07/08/18:  Procedure:                 Left breast lumpectomy with radioactive seed localization  Surgeon:                     Edsel Petrin. Dalbert Batman, M.D., [facility]  Past/Anticipated interventions by medical oncology, if any: Chemotherapy 07/14/18 Per Dr. Lindi Adie:  Recommendation: Based upon the fact that it is grade 2 and more than 1 cm, I recommended Oncotype DX testing. 1.  Oncotype DX testing 2.  Adjuvant radiation 3.  Followed by adjuvant antiestrogen therapy  Lymphedema issues, if any:  No  Pain issues, if any: No  SAFETY ISSUES:  Prior radiation? No  Pacemaker/ICD? No  Possible current pregnancy? No  Is the patient on methotrexate? No  Current Complaints / other details:  Pt presents today for consult with Dr. Sondra Come. Pt is unaccompanied but plans to put husband on speaker phone during consultation with physician.   BP (!) 147/68 (BP Location: Left Arm, Patient Position: Sitting)   Pulse 82   Temp 98.2 F  (36.8 C) (Oral)   Resp 18   Ht _0  (1.727 m)   Wt 267 lb (121.1 kg)   SpO2 97%   BMI 40.60 kg/m   Wt Readings from Last 3 Encounters:  08/22/18 267 lb (121.1 kg)  07/14/18 270 lb 14.4 oz (122.9 kg)  07/08/18 269 lb 6.4 oz (122.2 kg)       Loma Sousa, RN 08/22/2018,1:02 PM

## 2018-08-22 ENCOUNTER — Encounter: Payer: Self-pay | Admitting: Radiation Oncology

## 2018-08-22 ENCOUNTER — Ambulatory Visit: Admission: RE | Admit: 2018-08-22 | Payer: Medicare Other | Source: Ambulatory Visit | Admitting: Radiation Oncology

## 2018-08-22 ENCOUNTER — Ambulatory Visit
Admission: RE | Admit: 2018-08-22 | Discharge: 2018-08-22 | Disposition: A | Discharge status: Home / Self Care | Payer: Medicare Other | Source: Ambulatory Visit | Attending: Radiation Oncology | Admitting: Radiation Oncology

## 2018-08-22 ENCOUNTER — Other Ambulatory Visit: Payer: Self-pay

## 2018-08-22 VITALS — BP 147/68 | HR 82 | Temp 98.2°F | Resp 18 | Ht 68.0 in | Wt 267.0 lb

## 2018-08-22 DIAGNOSIS — Z170 Estrogen receptor positive status [ER+]: Secondary | ICD-10-CM | POA: Insufficient documentation

## 2018-08-22 DIAGNOSIS — R232 Flushing: Secondary | ICD-10-CM | POA: Diagnosis not present

## 2018-08-22 DIAGNOSIS — C50412 Malignant neoplasm of upper-outer quadrant of left female breast: Secondary | ICD-10-CM

## 2018-08-22 DIAGNOSIS — Z7984 Long term (current) use of oral hypoglycemic drugs: Secondary | ICD-10-CM | POA: Insufficient documentation

## 2018-08-22 DIAGNOSIS — Z7982 Long term (current) use of aspirin: Secondary | ICD-10-CM | POA: Insufficient documentation

## 2018-08-22 DIAGNOSIS — Z79899 Other long term (current) drug therapy: Secondary | ICD-10-CM | POA: Diagnosis not present

## 2018-08-22 NOTE — Patient Instructions (Signed)
Coronavirus (COVID-19) Are you at risk?  Are you at risk for the Coronavirus (COVID-19)?  To be considered HIGH RISK for Coronavirus (COVID-19), you have to meet the following criteria:  . Traveled to [place], [place], [place], [place] or [place]; or in the [place] to [place], [place], [place], or [place]; and have fever, cough, and shortness of breath within the last 2 weeks of travel OR . Been in close contact with a person diagnosed with COVID-19 within the last 2 weeks and have fever, cough, and shortness of breath . IF YOU DO NOT MEET THESE CRITERIA, YOU ARE CONSIDERED LOW RISK FOR COVID-19.  What to do if you are HIGH RISK for COVID-19?  . If you are having a medical emergency, call 911. . Seek medical care right away. Before you go to a doctor's office, urgent care or emergency department, call ahead and tell them about your recent travel, contact with someone diagnosed with COVID-19, and your symptoms. You should receive instructions from your physician's office regarding next steps of care.  . When you arrive at healthcare provider, tell the healthcare staff immediately you have returned from visiting [place], [place], [place], [place] or [place]; or traveled in the [place] to [place], [place], [place], or [place]; in the last two weeks or you have been in close contact with a person diagnosed with COVID-19 in the last 2 weeks.   . Tell the health care staff about your symptoms: fever, cough and shortness of breath. . After you have been seen by a medical provider, you will be either: o Tested for (COVID-19) and discharged home on quarantine except to seek medical care if symptoms worsen, and asked to  - Stay home and avoid contact with others until you get your results (4-5 days)  - Avoid travel on public transportation if possible (such as bus, train, or airplane) or o Sent to the Emergency Department by EMS for evaluation, COVID-19 testing, and possible  admission depending on your condition and test results.  What to do if you are LOW RISK for COVID-19?  Reduce your risk of any infection by using the same precautions used for avoiding the common cold or flu:  . Wash your hands often with soap and warm water for at least 20 seconds.  If soap and water are not readily available, use an alcohol-based hand sanitizer with at least 60% alcohol.  . If coughing or sneezing, cover your mouth and nose by coughing or sneezing into the elbow areas of your shirt or coat, into a tissue or into your sleeve (not your hands). . Avoid shaking hands with others and consider head nods or verbal greetings only. . Avoid touching your eyes, nose, or mouth with unwashed hands.  . Avoid close contact with people who are sick. . Avoid places or events with large numbers of people in one location, like concerts or sporting events. . Carefully consider travel plans you have or are making. . If you are planning any travel outside or inside the [place], visit the CDC's Travelers' Health webpage for the latest health notices. . If you have some symptoms but not all symptoms, continue to monitor at home and seek medical attention if your symptoms worsen. . If you are having a medical emergency, call 911.   ADDITIONAL HEALTHCARE OPTIONS FOR PATIENTS  [facility] / e-Visit: [email or web address]         [facility]: [phone]  [place]   [facility]: [phone]                   [facility]: [phone]   

## 2018-08-22 NOTE — Progress Notes (Signed)
Radiation Oncology         (336) [phone] ________________________________  Name: Linda Li MRN: [id number]  Date: 08/22/2018  DOB: 11-04-1945  Reevaluation Visit Note  CC: Katherina Mires, MD  Nicholas Lose, MD    ICD-10-CM   1. 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  she is doing well overall.    Since they were last seen in the office, they had a left lumpectomy with seed on 07/08/18. Pathology from this procedure confirmed invasive ductal carcinoma, grade II, measuring 1.3 cm. Resection margins were negative for carcinoma (greater than 1 cm). She was negative for lymphovascular or perineural invasion. There were biopsy site changes present.                 On review of systems, she reports significant problems with hot flashes but has been on Lexapro which has helped with this issue. She has mild discomfort in the left breast but no pain or problems with lymphedema.  she denies any other symptoms. Pertinent positives are listed and detailed within the above HPI.                 ALLERGIES:  is allergic to shellfish-derived products.  Meds: Current Outpatient Medications  Medication Sig Dispense Refill  . aspirin EC 81 MG tablet Take 81 mg by mouth daily.    . Calcium Citrate 200 MG TABS Take 500 mg by mouth.    . Cholecalciferol (VITAMIN D3) 1000 UNITS CAPS Take 2,000 Units by mouth.    . Cyanocobalamin ([employer]S VITAMIN B-12) 5000 MCG SUBL Place under the tongue.    . escitalopram (LEXAPRO) 10 MG tablet TAKE 1 TABLET BY MOUTH EVERY DAY 90 tablet 0  . glucose blood test strip CHECK BLOOD SUGAR DAILY.    Marland Kitchen lisinopril (PRINIVIL,ZESTRIL) 10 MG tablet Take 20 mg by mouth.     . Magnesium 400 MG CAPS Take by mouth.    . Melatonin  5 MG TABS Take 5 mg by mouth at bedtime.    . metFORMIN (GLUCOPHAGE) 500 MG tablet Take by mouth daily with breakfast.    . Omega-3 Fatty Acids (FISH OIL) 1000 MG CAPS Take 1 capsule by mouth.    Marland Kitchen OVER THE COUNTER MEDICATION alphalopoeic acid daily    . OVER THE COUNTER MEDICATION Take 1 tablet by mouth at bedtime. 5-HTP Plus (Natrol)    . OVER THE COUNTER MEDICATION Take 1 tablet by mouth at bedtime. Ashwaganda root.    . SUMAtriptan (IMITREX) 100 MG tablet 1 po immediately at onset of migraine; may repeat in 2 hrs if HA persists; no more than 2 in 24 hrs    . TURMERIC PO Take by mouth.    Marland Kitchen UNABLE TO FIND Take 2 capsules by mouth daily. CINSULIN    . vitamin C (ASCORBIC ACID) 500 MG tablet Take 500 mg by mouth.    . Azelaic Acid 15 % cream Apply topically.    . Coenzyme Q10 (COQ10) 100 MG CAPS Take 100 mg by mouth.    . docusate sodium (COLACE) 100 MG capsule Take 100 mg by mouth 2 (two) times daily.     . fluticasone (FLONASE) 50 MCG/ACT nasal spray Place  1 spray into the nose.    . hydrOXYzine (VISTARIL) 50 MG capsule Take 1 PO by mouth 2 hours prior to procedure, may repeat if needed. 15 capsule 1  . metroNIDAZOLE (METROCREAM) 0.75 % cream Apply topically.     Current Facility-Administered Medications  Medication Dose Route Frequency Provider Last Rate Last Dose  . lidocaine (PF) (XYLOCAINE) 1 % injection 0.3 mL  0.3 mL Other Once Magnus Sinning, MD        Physical Findings: The patient is in no acute distress. Patient is alert and oriented.  height is _0  (1.727 m) and weight is 267 lb (121.1 kg). Her oral temperature is 98.2 F (36.8 C). Her blood pressure is 147/68 (abnormal) and her pulse is 82. Her respiration is 18 and oxygen saturation is 97%. .  No significant changes. Lungs are clear to auscultation bilaterally. Heart has regular rate and rhythm. No palpable cervical, supraclavicular, or axillary adenopathy. Abdomen soft, non-tender, normal bowel sounds. Right breast with  no palpable mass, nipple discharge, or bleeding. Left breast with a well-healed scar in the upper outer quadrant. No dominant mass, nipple discharge or bleeding.    Lab Findings: Lab Results  Component Value Date   WBC 9.4 06/15/2018   HGB 14.9 06/15/2018   HCT 43.2 06/15/2018   MCV 88.0 06/15/2018   PLT 251 06/15/2018    Radiographic Findings: No results found.  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 Lindi Adie who is recommending adjuvant hormonal therapy. I discussed with the patient options to consider for her management. We discussed that if she wants to be aggressive with her post operative mangement she could consider adjuvant hormonal therapy as well as adjuvant radiation therapy directed at the left breast. We also discussed that she may be a potential candidate for hormonal therapy alone given her excellent prognosis. We also discussed timing of her rad therapy as it relates to the COVID-19 pandemic. I discussed with the pt that radiation therapy in this situation would provide modest local control benefit but no overall improvement in survival. After careful evaluation, the pt would like to proceed with adjuvant hormonal therapy and delay her initiation of radiation therapy until June. The pt wants to start her aromatase inhibitor in the next few days. She has had significant problems with hot flashes which may potentially worsen with her AI. This two month trial of adjuvant hormonal therapy will allow her a good time to see if this will be a long-term option for her.  If not she will proceed with radiation therapy.  Today, I talked to the patient about the findings and work-up thus far.  We discussed the natural history of breast cancer and general treatment, highlighting the role of radiotherapy in the management.  We discussed the available radiation techniques, and focused on the  details of logistics and delivery.  We reviewed the anticipated acute and late sequelae associated with radiation in this setting.  The patient was encouraged to ask questions that I answered to the best of my ability.  A patient consent form was discussed and signed.  We retained a copy for our records.  The patient would like to proceed with radiation and will be scheduled for CT simulation.   Plan:  The pt is tentatively scheduled for simulation on June 9th at [facility]. She would be a good candidate for  hypofractionated accelerated radiation therapy. Will use cardiac-sparing techniques if necessary. Anticipate 4 weeks of radiation therapy. I will forward my reevaluation note to Dr. Lindi Adie so that he can initiate adjuvant hormonal therapy in the interim.  ____________________________________   Blair Promise, PhD, MD    This document serves as a record of services personally performed by Gery Pray, MD. It was created on his behalf by Mary-Margaret Loma Messing, a trained medical scribe. The creation of this record is based on the scribe's personal observations and the provider's statements to them. This document has been checked and approved by the attending provider.

## 2018-08-23 ENCOUNTER — Telehealth: Payer: Self-pay | Admitting: *Deleted

## 2018-08-23 MED ORDER — ANASTROZOLE 1 MG PO TABS: 1.0000 mg | ORAL_TABLET | Freq: Every day | ORAL | 6 refills | Status: DC

## 2018-08-23 NOTE — Telephone Encounter (Signed)
Left vm informing pt that anastrozole has been sent to pharmacy. Request return call with questions.

## 2018-10-25 ENCOUNTER — Ambulatory Visit
Admission: RE | Admit: 2018-10-25 | Discharge: 2018-10-25 | Disposition: A | Discharge status: Home / Self Care | Payer: Medicare Other | Source: Ambulatory Visit | Attending: Radiation Oncology | Admitting: Radiation Oncology

## 2018-10-25 ENCOUNTER — Other Ambulatory Visit: Payer: Self-pay

## 2018-10-25 DIAGNOSIS — C50412 Malignant neoplasm of upper-outer quadrant of left female breast: Secondary | ICD-10-CM | POA: Diagnosis not present

## 2018-10-25 DIAGNOSIS — Z170 Estrogen receptor positive status [ER+]: Secondary | ICD-10-CM | POA: Insufficient documentation

## 2018-10-25 DIAGNOSIS — Z7982 Long term (current) use of aspirin: Secondary | ICD-10-CM | POA: Insufficient documentation

## 2018-10-25 DIAGNOSIS — Z7984 Long term (current) use of oral hypoglycemic drugs: Secondary | ICD-10-CM | POA: Insufficient documentation

## 2018-10-25 DIAGNOSIS — R232 Flushing: Secondary | ICD-10-CM | POA: Diagnosis not present

## 2018-10-25 DIAGNOSIS — Z79899 Other long term (current) drug therapy: Secondary | ICD-10-CM | POA: Diagnosis not present

## 2018-10-26 NOTE — Progress Notes (Signed)
  Radiation Oncology         [phone] ________________________________  Name: Linda Li MRN: [id number]  Date: 10/25/2018  DOB: 1945/12/13  SIMULATION AND TREATMENT PLANNING NOTE    ICD-10-CM   1. Malignant neoplasm of upper-outer quadrant of left breast in female, estrogen receptor positive ([place]) C50.412    Z17.0     DIAGNOSIS:  StageIA (pT1c, pN0)leftBreast UOQ Invasive Ductal Carcinomawith DCIS, ER+/ PR+/ Her2-, GradeII   NARRATIVE:  The patient was brought to the [facility].  Identity was confirmed.  All relevant records and images related to the planned course of therapy were reviewed.  The patient freely provided informed written consent to proceed with treatment after reviewing the details related to the planned course of therapy. The consent form was witnessed and verified by the simulation staff.  Then, the patient was set-up in a stable reproducible  supine position for radiation therapy.  CT images were obtained.  Surface markings were placed.  The CT images were loaded into the planning software.  Then the target and avoidance structures were contoured.  Treatment planning then occurred.  The radiation prescription was entered and confirmed.  Then, I designed and supervised the construction of a total of 5 medically necessary complex treatment devices.  I have requested : 3D Simulation  I have requested a DVH of the following structures: Heart, lungs, lumpectomy cavity.  I have ordered:CBC  PLAN:  The patient will receive 40.05 Gy in 15 fractions followed by a boost to the lumpectomy cavity of 10 Gray 5 fractions for a cumulative dose of 50.05 Gray.    Optical Surface Tracking Plan:  Since intensity modulated radiotherapy (IMRT) and 3D conformal radiation treatment methods are predicated on accurate and precise positioning for treatment, intrafraction motion monitoring is medically necessary to ensure accurate and safe treatment delivery.  The  ability to quantify intrafraction motion without excessive ionizing radiation dose can only be performed with optical surface tracking. Accordingly, surface imaging offers the opportunity to obtain 3D measurements of patient position throughout IMRT and 3D treatments without excessive radiation exposure.  I am ordering optical surface tracking for this patient's upcoming course of radiotherapy. ________________________________   -----------------------------------  Blair Promise, PhD, MD

## 2018-10-28 ENCOUNTER — Telehealth: Payer: Self-pay | Admitting: Hematology and Oncology

## 2018-10-28 NOTE — Telephone Encounter (Signed)
Left message re July appointment. Schedule mailed.

## 2018-11-01 ENCOUNTER — Ambulatory Visit
Admission: RE | Admit: 2018-11-01 | Discharge: 2018-11-01 | Disposition: A | Discharge status: Home / Self Care | Payer: Medicare Other | Source: Ambulatory Visit | Attending: Radiation Oncology | Admitting: Radiation Oncology

## 2018-11-01 ENCOUNTER — Other Ambulatory Visit: Payer: Self-pay

## 2018-11-01 DIAGNOSIS — C50412 Malignant neoplasm of upper-outer quadrant of left female breast: Secondary | ICD-10-CM

## 2018-11-01 DIAGNOSIS — Z170 Estrogen receptor positive status [ER+]: Secondary | ICD-10-CM

## 2018-11-01 MED ORDER — RADIAPLEXRX EX GEL
Freq: Once | CUTANEOUS | Status: AC
  Administered 2018-11-01: 17:00:00 via TOPICAL

## 2018-11-01 MED ORDER — ALRA NON-METALLIC DEODORANT (RAD-ONC)
1.0000 "application " | Freq: Once | TOPICAL | Status: AC
  Administered 2018-11-01: 1 via TOPICAL

## 2018-11-01 NOTE — Progress Notes (Signed)
  Radiation Oncology         (336) [phone] ________________________________  Name: Linda Li MRN: [id number]  Date: 11/01/2018  DOB: 1945-12-16  Simulation Verification Note    ICD-10-CM   1. Malignant neoplasm of upper-outer quadrant of left breast in female, estrogen receptor positive ([facility])  Y61.683 non-metallic deodorant (ALRA) 1 application   F29.0 hyaluronate sodium (RADIAPLEXRX) gel    Status: outpatient  NARRATIVE: The patient was brought to the treatment unit and placed in the planned treatment position. The clinical setup was verified. Then port films were obtained and uploaded to the radiation oncology medical record software.  The treatment beams were carefully compared against the planned radiation fields. The position location and shape of the radiation fields was reviewed. They targeted volume of tissue appears to be appropriately covered by the radiation beams. Organs at risk appear to be excluded as planned.  Based on my personal review, I approved the simulation verification. The patient's treatment will proceed as planned.  -----------------------------------  Blair Promise, PhD, MD

## 2018-11-02 ENCOUNTER — Other Ambulatory Visit: Payer: Self-pay

## 2018-11-02 ENCOUNTER — Ambulatory Visit
Admission: RE | Admit: 2018-11-02 | Discharge: 2018-11-02 | Disposition: A | Discharge status: Home / Self Care | Payer: Medicare Other | Source: Ambulatory Visit | Attending: Radiation Oncology | Admitting: Radiation Oncology

## 2018-11-02 DIAGNOSIS — C50412 Malignant neoplasm of upper-outer quadrant of left female breast: Secondary | ICD-10-CM | POA: Diagnosis not present

## 2018-11-03 ENCOUNTER — Ambulatory Visit
Admission: RE | Admit: 2018-11-03 | Discharge: 2018-11-03 | Disposition: A | Discharge status: Home / Self Care | Payer: Medicare Other | Source: Ambulatory Visit | Attending: Radiation Oncology | Admitting: Radiation Oncology

## 2018-11-03 ENCOUNTER — Other Ambulatory Visit: Payer: Self-pay

## 2018-11-03 DIAGNOSIS — C50412 Malignant neoplasm of upper-outer quadrant of left female breast: Secondary | ICD-10-CM | POA: Diagnosis not present

## 2018-11-04 ENCOUNTER — Ambulatory Visit
Admission: RE | Admit: 2018-11-04 | Discharge: 2018-11-04 | Disposition: A | Discharge status: Home / Self Care | Payer: Medicare Other | Source: Ambulatory Visit | Attending: Radiation Oncology | Admitting: Radiation Oncology

## 2018-11-04 ENCOUNTER — Other Ambulatory Visit: Payer: Self-pay

## 2018-11-04 DIAGNOSIS — C50412 Malignant neoplasm of upper-outer quadrant of left female breast: Secondary | ICD-10-CM | POA: Diagnosis not present

## 2018-11-05 ENCOUNTER — Other Ambulatory Visit: Payer: Self-pay | Admitting: Hematology and Oncology

## 2018-11-07 ENCOUNTER — Other Ambulatory Visit: Payer: Self-pay

## 2018-11-07 ENCOUNTER — Ambulatory Visit
Admission: RE | Admit: 2018-11-07 | Discharge: 2018-11-07 | Disposition: A | Discharge status: Home / Self Care | Payer: Medicare Other | Source: Ambulatory Visit | Attending: Radiation Oncology | Admitting: Radiation Oncology

## 2018-11-07 DIAGNOSIS — C50412 Malignant neoplasm of upper-outer quadrant of left female breast: Secondary | ICD-10-CM | POA: Diagnosis not present

## 2018-11-08 ENCOUNTER — Ambulatory Visit
Admission: RE | Admit: 2018-11-08 | Discharge: 2018-11-08 | Disposition: A | Discharge status: Home / Self Care | Payer: Medicare Other | Source: Ambulatory Visit | Attending: Radiation Oncology | Admitting: Radiation Oncology

## 2018-11-08 ENCOUNTER — Other Ambulatory Visit: Payer: Self-pay

## 2018-11-08 DIAGNOSIS — C50412 Malignant neoplasm of upper-outer quadrant of left female breast: Secondary | ICD-10-CM | POA: Diagnosis not present

## 2018-11-09 ENCOUNTER — Ambulatory Visit
Admission: RE | Admit: 2018-11-09 | Discharge: 2018-11-09 | Disposition: A | Discharge status: Home / Self Care | Payer: Medicare Other | Source: Ambulatory Visit | Attending: Radiation Oncology | Admitting: Radiation Oncology

## 2018-11-09 ENCOUNTER — Other Ambulatory Visit: Payer: Self-pay

## 2018-11-09 DIAGNOSIS — C50412 Malignant neoplasm of upper-outer quadrant of left female breast: Secondary | ICD-10-CM | POA: Diagnosis not present

## 2018-11-10 ENCOUNTER — Ambulatory Visit: Payer: Medicare Other

## 2018-11-11 ENCOUNTER — Ambulatory Visit
Admission: RE | Admit: 2018-11-11 | Discharge: 2018-11-11 | Disposition: A | Discharge status: Home / Self Care | Payer: Medicare Other | Source: Ambulatory Visit | Attending: Radiation Oncology | Admitting: Radiation Oncology

## 2018-11-11 ENCOUNTER — Other Ambulatory Visit: Payer: Self-pay

## 2018-11-11 DIAGNOSIS — C50412 Malignant neoplasm of upper-outer quadrant of left female breast: Secondary | ICD-10-CM | POA: Diagnosis not present

## 2018-11-14 ENCOUNTER — Other Ambulatory Visit: Payer: Self-pay

## 2018-11-14 ENCOUNTER — Ambulatory Visit
Admission: RE | Admit: 2018-11-14 | Discharge: 2018-11-14 | Disposition: A | Discharge status: Home / Self Care | Payer: Medicare Other | Source: Ambulatory Visit | Attending: Radiation Oncology | Admitting: Radiation Oncology

## 2018-11-14 DIAGNOSIS — C50412 Malignant neoplasm of upper-outer quadrant of left female breast: Secondary | ICD-10-CM | POA: Diagnosis not present

## 2018-11-14 NOTE — Assessment & Plan Note (Signed)
07/08/2018: Left lumpectomy: Grade 2 IDC, 1.3 cm, margins negative, negative for lymphovascular or perineural invasion, ER 100%, PR 90%, HER-2 -1+, Ki-67 3%, T1CNX stage Ia Oncotype DX recurrence score 4, risk of distant recurrence: 3%, low risk Adjuvant radiation therapy started 11/04/2018 Adjuvant antiestrogen therapy started April 2020  Anastrozole toxicities:  Return to clinic in 3 months for survivorship care plan visit

## 2018-11-15 ENCOUNTER — Other Ambulatory Visit: Payer: Self-pay

## 2018-11-15 ENCOUNTER — Ambulatory Visit
Admission: RE | Admit: 2018-11-15 | Discharge: 2018-11-15 | Disposition: A | Discharge status: Home / Self Care | Payer: Medicare Other | Source: Ambulatory Visit | Attending: Radiation Oncology | Admitting: Radiation Oncology

## 2018-11-15 DIAGNOSIS — C50412 Malignant neoplasm of upper-outer quadrant of left female breast: Secondary | ICD-10-CM | POA: Diagnosis not present

## 2018-11-16 ENCOUNTER — Other Ambulatory Visit: Payer: Self-pay

## 2018-11-16 ENCOUNTER — Ambulatory Visit
Admission: RE | Admit: 2018-11-16 | Discharge: 2018-11-16 | Disposition: A | Discharge status: Home / Self Care | Payer: Medicare Other | Source: Ambulatory Visit | Attending: Radiation Oncology | Admitting: Radiation Oncology

## 2018-11-16 DIAGNOSIS — Z170 Estrogen receptor positive status [ER+]: Secondary | ICD-10-CM | POA: Insufficient documentation

## 2018-11-16 DIAGNOSIS — R232 Flushing: Secondary | ICD-10-CM | POA: Insufficient documentation

## 2018-11-16 DIAGNOSIS — Z7984 Long term (current) use of oral hypoglycemic drugs: Secondary | ICD-10-CM | POA: Diagnosis not present

## 2018-11-16 DIAGNOSIS — C50412 Malignant neoplasm of upper-outer quadrant of left female breast: Secondary | ICD-10-CM | POA: Diagnosis not present

## 2018-11-16 DIAGNOSIS — Z7982 Long term (current) use of aspirin: Secondary | ICD-10-CM | POA: Diagnosis not present

## 2018-11-16 DIAGNOSIS — Z79899 Other long term (current) drug therapy: Secondary | ICD-10-CM | POA: Insufficient documentation

## 2018-11-17 ENCOUNTER — Ambulatory Visit
Admission: RE | Admit: 2018-11-17 | Discharge: 2018-11-17 | Disposition: A | Discharge status: Home / Self Care | Payer: Medicare Other | Source: Ambulatory Visit | Attending: Radiation Oncology | Admitting: Radiation Oncology

## 2018-11-17 ENCOUNTER — Other Ambulatory Visit: Payer: Self-pay

## 2018-11-17 DIAGNOSIS — C50412 Malignant neoplasm of upper-outer quadrant of left female breast: Secondary | ICD-10-CM | POA: Diagnosis not present

## 2018-11-18 NOTE — Progress Notes (Signed)
Patient Care Team: Katherina Mires, MD as PCP - General (Family Medicine) Fanny Skates, MD as Consulting Physician (General Surgery) Nicholas Lose, MD as Consulting Physician (Hematology and Oncology) Gery Pray, MD as Consulting Physician (Radiation Oncology)  DIAGNOSIS:    ICD-10-CM   1. Malignant neoplasm of upper-outer quadrant of left breast in female, estrogen receptor positive ([place])  C50.412    Z17.0     SUMMARY OF ONCOLOGIC HISTORY: Oncology History  Malignant neoplasm of upper-outer quadrant of left breast in female, estrogen receptor positive ([place])  06/13/2018 Initial Diagnosis   Screening detected left breast asymmetry with distortion, ultrasound 8 mm at 12 o'clock position, axilla negative, ultrasound-guided biopsy revealed grade 1 IDC with DCIS ER 100%, PR 100%, HER-2 -1+ by IHC, Ki-67 3%, T1BN0 stage Ia clinical stage   07/08/2018 Surgery   Left lumpectomy: Grade 2 IDC, 1.3 cm, margins negative, negative for lymphovascular or perineural invasion, ER 100%, PR 90%, HER-2 -1+, Ki-67 3%, T1CNX stage Ia    07/20/2018 Cancer Staging   Staging form: Breast, AJCC 8th Edition - Pathologic: Stage IA (pT1c, pN0, cM0, G2, ER+, PR+, HER2-) - Signed by Gardenia Phlegm, NP on 07/20/2018   07/25/2018 Oncotype testing   Oncotype DX recurrence score 4, distant recurrence risk at 9 years: 3%, low risk   08/23/2018 -  Anti-estrogen oral therapy   Anastrozole 1 mg daily   11/02/2018 -  Radiation Therapy   Adjuvant radiation therapy     CHIEF COMPLIANT: Follow-up during radiation to discuss further treatment  INTERVAL HISTORY: Linda Li is a 73 y.o. with above-mentioned history of left breast cancer who underwent a mastectomy and is currently on radiation therapy.  She presents to the clinic today to discuss anti-estrogen therapy.  Her biggest complaint today is a severe hot flashes which were already bad before she begin anastrozole and they only got worse with  anastrozole.  She has been trying to find Yams which may help her hot flashes.  She also has radiation dermatitis.  REVIEW OF SYSTEMS:   Constitutional: Denies fevers, chills or abnormal weight loss, complains of severe hot flashes Eyes: Denies blurriness of vision Ears, nose, mouth, throat, and face: Denies mucositis or sore throat Respiratory: Denies cough, dyspnea or wheezes Cardiovascular: Denies palpitation, chest discomfort Gastrointestinal: Denies nausea, heartburn or change in bowel habits Skin: Denies abnormal skin rashes Lymphatics: Denies new lymphadenopathy or easy bruising Neurological: Denies numbness, tingling or new weaknesses Behavioral/Psych: Mood is stable, no new changes  Extremities: No lower extremity edema Breast: Radiation dermatitis All other systems were reviewed with the patient and are negative.  I have reviewed the past medical history, past surgical history, social history and family history with the patient and they are unchanged from previous note.  ALLERGIES:  is allergic to shellfish-derived products.  MEDICATIONS:  Current Outpatient Medications  Medication Sig Dispense Refill   anastrozole (ARIMIDEX) 1 MG tablet Take 1 tablet (1 mg total) by mouth daily. 30 tablet 6   aspirin EC 81 MG tablet Take 81 mg by mouth daily.     Azelaic Acid 15 % cream Apply topically.     Calcium Citrate 200 MG TABS Take 500 mg by mouth.     Cholecalciferol (VITAMIN D3) 1000 UNITS CAPS Take 2,000 Units by mouth.     Coenzyme Q10 (COQ10) 100 MG CAPS Take 100 mg by mouth.     Cyanocobalamin (CVS VITAMIN B-12) 5000 MCG SUBL Place under the tongue.  docusate sodium (COLACE) 100 MG capsule Take 100 mg by mouth 2 (two) times daily.      escitalopram (LEXAPRO) 10 MG tablet TAKE 1 TABLET BY MOUTH EVERY DAY 90 tablet 0   fluticasone (FLONASE) 50 MCG/ACT nasal spray Place 1 spray into the nose.     glucose blood test strip CHECK BLOOD SUGAR DAILY.     hydrOXYzine  (VISTARIL) 50 MG capsule Take 1 PO by mouth 2 hours prior to procedure, may repeat if needed. 15 capsule 1   lisinopril (PRINIVIL,ZESTRIL) 10 MG tablet Take 20 mg by mouth.      Magnesium 400 MG CAPS Take by mouth.     Melatonin 5 MG TABS Take 5 mg by mouth at bedtime.     metFORMIN (GLUCOPHAGE) 500 MG tablet Take by mouth daily with breakfast.     metroNIDAZOLE (METROCREAM) 0.75 % cream Apply topically.     Omega-3 Fatty Acids (FISH OIL) 1000 MG CAPS Take 1 capsule by mouth.     OVER THE COUNTER MEDICATION alphalopoeic acid daily     OVER THE COUNTER MEDICATION Take 1 tablet by mouth at bedtime. 5-HTP Plus (Natrol)     OVER THE COUNTER MEDICATION Take 1 tablet by mouth at bedtime. Ashwaganda root.     SUMAtriptan (IMITREX) 100 MG tablet 1 po immediately at onset of migraine; may repeat in 2 hrs if HA persists; no more than 2 in 24 hrs     TURMERIC PO Take by mouth.     UNABLE TO FIND Take 2 capsules by mouth daily. CINSULIN     vitamin C (ASCORBIC ACID) 500 MG tablet Take 500 mg by mouth.     Current Facility-Administered Medications  Medication Dose Route Frequency Provider Last Rate Last Dose   lidocaine (PF) (XYLOCAINE) 1 % injection 0.3 mL  0.3 mL Other Once Magnus Sinning, MD        PHYSICAL EXAMINATION: ECOG PERFORMANCE STATUS: 1 - Symptomatic but completely ambulatory  Vitals:   11/21/18 1004  BP: (!) 146/70  Pulse: 80  Resp: 18  Temp: 98.5 F (36.9 C)  SpO2: 97%   Filed Weights   11/21/18 1004  Weight: 268 lb 1.6 oz (121.6 kg)    Physical exam not done due to COVID-19 precautions  LABORATORY DATA:  I have reviewed the data as listed CMP Latest Ref Rng & Units 06/15/2018  Glucose 70 - 99 mg/dL 132(H)  BUN 8 - 23 mg/dL 19  Creatinine 0.44 - 1.00 mg/dL 0.75  Sodium 135 - 145 mmol/L 140  Potassium 3.5 - 5.1 mmol/L 4.4  Chloride 98 - 111 mmol/L 104  CO2 22 - 32 mmol/L 24  Calcium 8.9 - 10.3 mg/dL 10.0  Total Protein 6.5 - 8.1 g/dL 7.5  Total  Bilirubin 0.3 - 1.2 mg/dL 0.3  Alkaline Phos 38 - 126 U/L 97  AST 15 - 41 U/L 14(L)  ALT 0 - 44 U/L 19    Lab Results  Component Value Date   WBC 9.4 06/15/2018   HGB 14.9 06/15/2018   HCT 43.2 06/15/2018   MCV 88.0 06/15/2018   PLT 251 06/15/2018   NEUTROABS 4.9 06/15/2018    ASSESSMENT & PLAN:  Malignant neoplasm of upper-outer quadrant of left breast in female, estrogen receptor positive ([place] [facility]) 07/08/2018: Left lumpectomy: Grade 2 IDC, 1.3 cm, margins negative, negative for lymphovascular or perineural invasion, ER 100%, PR 90%, HER-2 -1+, Ki-67 3%, T1CNX stage Ia Oncotype DX recurrence score 4, risk of distant recurrence:  3%, low risk Adjuvant radiation therapy started 11/04/2018 Adjuvant antiestrogen therapy started April 2020  Anastrozole toxicities: Severe hot flashes: I discussed with her about taking the anastrozole at different point in the day as well as cutting it in half and taking half a tablet in the morning half tablet in evening and if none of this works she can switch to a different antiestrogen therapy. She also has gabapentin from previous times and I have asked her that to take gabapentin at bedtime as well.  She will call us if she wants to change her antiestrogen therapy. Return to clinic in 3 months for survivorship care plan visit  No orders of the defined types were placed in this encounter.  The patient has a good understanding of the overall plan. she agrees with it. she will call with any problems that may develop before the next visit here.  Nicholas Lose, MD 11/21/2018  Julious Oka Dorshimer am acting as scribe for Dr. Nicholas Lose.  I have reviewed the above documentation for accuracy and completeness, and I agree with the above.

## 2018-11-21 ENCOUNTER — Inpatient Hospital Stay: Payer: Medicare Other | Attending: Hematology and Oncology | Admitting: Hematology and Oncology

## 2018-11-21 ENCOUNTER — Ambulatory Visit
Admission: RE | Admit: 2018-11-21 | Discharge: 2018-11-21 | Disposition: A | Discharge status: Home / Self Care | Payer: Medicare Other | Source: Ambulatory Visit | Attending: Radiation Oncology | Admitting: Radiation Oncology

## 2018-11-21 ENCOUNTER — Other Ambulatory Visit: Payer: Self-pay

## 2018-11-21 DIAGNOSIS — Z79811 Long term (current) use of aromatase inhibitors: Secondary | ICD-10-CM | POA: Diagnosis not present

## 2018-11-21 DIAGNOSIS — Z170 Estrogen receptor positive status [ER+]: Secondary | ICD-10-CM | POA: Insufficient documentation

## 2018-11-21 DIAGNOSIS — N951 Menopausal and female climacteric states: Secondary | ICD-10-CM | POA: Diagnosis not present

## 2018-11-21 DIAGNOSIS — C50412 Malignant neoplasm of upper-outer quadrant of left female breast: Secondary | ICD-10-CM | POA: Insufficient documentation

## 2018-11-21 DIAGNOSIS — Z9012 Acquired absence of left breast and nipple: Secondary | ICD-10-CM | POA: Diagnosis not present

## 2018-11-22 ENCOUNTER — Ambulatory Visit
Admission: RE | Admit: 2018-11-22 | Discharge: 2018-11-22 | Disposition: A | Discharge status: Home / Self Care | Payer: Medicare Other | Source: Ambulatory Visit | Attending: Radiation Oncology | Admitting: Radiation Oncology

## 2018-11-22 ENCOUNTER — Telehealth: Payer: Self-pay | Admitting: Adult Health

## 2018-11-22 ENCOUNTER — Other Ambulatory Visit: Payer: Self-pay

## 2018-11-22 DIAGNOSIS — C50412 Malignant neoplasm of upper-outer quadrant of left female breast: Secondary | ICD-10-CM | POA: Diagnosis not present

## 2018-11-22 DIAGNOSIS — Z170 Estrogen receptor positive status [ER+]: Secondary | ICD-10-CM

## 2018-11-22 MED ORDER — RADIAPLEXRX EX GEL
Freq: Two times a day (BID) | CUTANEOUS | Status: DC
  Administered 2018-11-22: 12:00:00 via TOPICAL

## 2018-11-22 NOTE — Telephone Encounter (Signed)
I talk with patient regarding schedule  

## 2018-11-23 ENCOUNTER — Other Ambulatory Visit: Payer: Self-pay

## 2018-11-23 ENCOUNTER — Ambulatory Visit: Payer: Medicare Other

## 2018-11-23 DIAGNOSIS — C50412 Malignant neoplasm of upper-outer quadrant of left female breast: Secondary | ICD-10-CM | POA: Diagnosis not present

## 2018-11-24 ENCOUNTER — Other Ambulatory Visit: Payer: Self-pay

## 2018-11-24 ENCOUNTER — Ambulatory Visit: Payer: Medicare Other

## 2018-11-24 DIAGNOSIS — C50412 Malignant neoplasm of upper-outer quadrant of left female breast: Secondary | ICD-10-CM | POA: Diagnosis not present

## 2018-11-25 ENCOUNTER — Ambulatory Visit
Admission: RE | Admit: 2018-11-25 | Discharge: 2018-11-25 | Disposition: A | Discharge status: Home / Self Care | Payer: Medicare Other | Source: Ambulatory Visit | Attending: Radiation Oncology | Admitting: Radiation Oncology

## 2018-11-25 ENCOUNTER — Other Ambulatory Visit: Payer: Self-pay

## 2018-11-25 DIAGNOSIS — C50412 Malignant neoplasm of upper-outer quadrant of left female breast: Secondary | ICD-10-CM | POA: Diagnosis not present

## 2018-11-28 ENCOUNTER — Ambulatory Visit
Admission: RE | Admit: 2018-11-28 | Discharge: 2018-11-28 | Disposition: A | Discharge status: Home / Self Care | Payer: Medicare Other | Source: Ambulatory Visit | Attending: Radiation Oncology | Admitting: Radiation Oncology

## 2018-11-28 ENCOUNTER — Other Ambulatory Visit: Payer: Self-pay

## 2018-11-28 DIAGNOSIS — C50412 Malignant neoplasm of upper-outer quadrant of left female breast: Secondary | ICD-10-CM | POA: Diagnosis not present

## 2018-11-29 ENCOUNTER — Ambulatory Visit: Payer: Medicare Other

## 2018-11-29 ENCOUNTER — Ambulatory Visit
Admission: RE | Admit: 2018-11-29 | Discharge: 2018-11-29 | Disposition: A | Discharge status: Home / Self Care | Payer: Medicare Other | Source: Ambulatory Visit | Attending: Radiation Oncology | Admitting: Radiation Oncology

## 2018-11-29 DIAGNOSIS — C50412 Malignant neoplasm of upper-outer quadrant of left female breast: Secondary | ICD-10-CM | POA: Diagnosis not present

## 2018-11-29 MED ORDER — SILVER SULFADIAZINE 1 % EX CREA
TOPICAL_CREAM | Freq: Two times a day (BID) | CUTANEOUS | Status: DC
  Administered 2018-11-29: 13:00:00 via TOPICAL

## 2018-11-30 ENCOUNTER — Encounter: Payer: Self-pay | Admitting: Radiation Oncology

## 2018-11-30 ENCOUNTER — Ambulatory Visit
Admission: RE | Admit: 2018-11-30 | Discharge: 2018-11-30 | Disposition: A | Discharge status: Home / Self Care | Payer: Medicare Other | Source: Ambulatory Visit | Attending: Radiation Oncology | Admitting: Radiation Oncology

## 2018-11-30 ENCOUNTER — Encounter: Payer: Self-pay | Admitting: *Deleted

## 2018-11-30 ENCOUNTER — Other Ambulatory Visit: Payer: Self-pay

## 2018-11-30 DIAGNOSIS — C50412 Malignant neoplasm of upper-outer quadrant of left female breast: Secondary | ICD-10-CM | POA: Diagnosis not present

## 2018-12-16 ENCOUNTER — Other Ambulatory Visit: Payer: Self-pay | Admitting: Hematology and Oncology

## 2018-12-17 ENCOUNTER — Other Ambulatory Visit: Payer: Self-pay | Admitting: Hematology and Oncology

## 2019-01-05 ENCOUNTER — Other Ambulatory Visit: Payer: Self-pay

## 2019-01-05 ENCOUNTER — Ambulatory Visit
Admission: RE | Admit: 2019-01-05 | Discharge: 2019-01-05 | Disposition: A | Discharge status: Home / Self Care | Payer: Medicare Other | Source: Ambulatory Visit | Attending: Radiation Oncology | Admitting: Radiation Oncology

## 2019-01-05 ENCOUNTER — Encounter: Payer: Self-pay | Admitting: Radiation Oncology

## 2019-01-05 VITALS — BP 136/64 | HR 67 | Temp 98.7°F | Resp 18 | Ht 68.0 in | Wt 268.5 lb

## 2019-01-05 DIAGNOSIS — C50412 Malignant neoplasm of upper-outer quadrant of left female breast: Secondary | ICD-10-CM | POA: Insufficient documentation

## 2019-01-05 DIAGNOSIS — R61 Generalized hyperhidrosis: Secondary | ICD-10-CM | POA: Insufficient documentation

## 2019-01-05 DIAGNOSIS — B3789 Other sites of candidiasis: Secondary | ICD-10-CM | POA: Insufficient documentation

## 2019-01-05 DIAGNOSIS — R232 Flushing: Secondary | ICD-10-CM | POA: Insufficient documentation

## 2019-01-05 DIAGNOSIS — Z170 Estrogen receptor positive status [ER+]: Secondary | ICD-10-CM | POA: Diagnosis not present

## 2019-01-05 DIAGNOSIS — Z79811 Long term (current) use of aromatase inhibitors: Secondary | ICD-10-CM | POA: Insufficient documentation

## 2019-01-05 DIAGNOSIS — R5383 Other fatigue: Secondary | ICD-10-CM | POA: Diagnosis not present

## 2019-01-05 DIAGNOSIS — Z7984 Long term (current) use of oral hypoglycemic drugs: Secondary | ICD-10-CM | POA: Diagnosis not present

## 2019-01-05 DIAGNOSIS — Z7982 Long term (current) use of aspirin: Secondary | ICD-10-CM | POA: Diagnosis not present

## 2019-01-05 DIAGNOSIS — Z79899 Other long term (current) drug therapy: Secondary | ICD-10-CM | POA: Insufficient documentation

## 2019-01-05 NOTE — Progress Notes (Signed)
  Radiation Oncology         [phone] ________________________________  Name: ESMAE DONATHAN MRN: [id number]  Date: 11/30/2018  DOB: [birth year]  End of Treatment Note  Diagnosis: Stage IA (pT1c, pN0) Left Breast UOQ Invasive Ductal Carcinomawith DCIS, ER+/ PR+/ Her2-, Grade2    Indication for treatment:  Curative       Radiation treatment dates:   11/01/2018 - 11/30/2018  Site/dose:    1. Left Breast / 40.05 Gy in 15 fractions 2. Boost / 10 Gy in 5 fractions  Beams/energy:    1. 3D, photons / 10X 2. Complex Isodose, photons /  6X, 10X  Narrative: The patient tolerated radiation treatment relatively well. She reported pain in her armpit area/the crease under her arm, as well as mild fatigue, as treatments continued. She was noted to have hyperpigmentation to the skin and some peeling under her arm.   Plan: The patient has completed radiation treatment. The patient will return to radiation oncology clinic for routine followup in one month. I advised them to call or return sooner if they have any questions or concerns related to their recovery or treatment.  -----------------------------------  Blair Promise, PhD, MD  This document serves as a record of services personally performed by Gery Pray, MD. It was created on his behalf by Wilburn Mylar, a trained medical scribe. The creation of this record is based on the scribe's personal observations and the provider's statements to them. This document has been checked and approved by the attending provider.

## 2019-01-05 NOTE — Patient Instructions (Signed)
Coronavirus (COVID-19) Are you at risk?  Are you at risk for the Coronavirus (COVID-19)?  To be considered HIGH RISK for Coronavirus (COVID-19), you have to meet the following criteria:  . Traveled to [place], [place], [place], [place] or [place]; or in the [place] to [place], [place], [place], or [place]; and have fever, cough, and shortness of breath within the last 2 weeks of travel OR . Been in close contact with a person diagnosed with COVID-19 within the last 2 weeks and have fever, cough, and shortness of breath . IF YOU DO NOT MEET THESE CRITERIA, YOU ARE CONSIDERED LOW RISK FOR COVID-19.  What to do if you are HIGH RISK for COVID-19?  . If you are having a medical emergency, call 911. . Seek medical care right away. Before you go to a doctor's office, urgent care or emergency department, call ahead and tell them about your recent travel, contact with someone diagnosed with COVID-19, and your symptoms. You should receive instructions from your physician's office regarding next steps of care.  . When you arrive at healthcare provider, tell the healthcare staff immediately you have returned from visiting [place], [place], [place], [place] or [place]; or traveled in the [place] to [place], [place], [place], or [place]; in the last two weeks or you have been in close contact with a person diagnosed with COVID-19 in the last 2 weeks.   . Tell the health care staff about your symptoms: fever, cough and shortness of breath. . After you have been seen by a medical provider, you will be either: o Tested for (COVID-19) and discharged home on quarantine except to seek medical care if symptoms worsen, and asked to  - Stay home and avoid contact with others until you get your results (4-5 days)  - Avoid travel on public transportation if possible (such as bus, train, or airplane) or o Sent to the Emergency Department by EMS for evaluation, COVID-19 testing, and possible  admission depending on your condition and test results.  What to do if you are LOW RISK for COVID-19?  Reduce your risk of any infection by using the same precautions used for avoiding the common cold or flu:  . Wash your hands often with soap and warm water for at least 20 seconds.  If soap and water are not readily available, use an alcohol-based hand sanitizer with at least 60% alcohol.  . If coughing or sneezing, cover your mouth and nose by coughing or sneezing into the elbow areas of your shirt or coat, into a tissue or into your sleeve (not your hands). . Avoid shaking hands with others and consider head nods or verbal greetings only. . Avoid touching your eyes, nose, or mouth with unwashed hands.  . Avoid close contact with people who are sick. . Avoid places or events with large numbers of people in one location, like concerts or sporting events. . Carefully consider travel plans you have or are making. . If you are planning any travel outside or inside the [place], visit the CDC's Travelers' Health webpage for the latest health notices. . If you have some symptoms but not all symptoms, continue to monitor at home and seek medical attention if your symptoms worsen. . If you are having a medical emergency, call 911.   ADDITIONAL HEALTHCARE OPTIONS FOR PATIENTS  [facility] / e-Visit: [email or web address]         [facility]: [phone]  [facility]: [phone]                   [facility]: [phone]   

## 2019-01-05 NOTE — Progress Notes (Signed)
Radiation Oncology         [phone] ________________________________  Name: Linda Li MRN: [id number]  Date: 01/05/2019  DOB: 14-Mar-1946  Follow-Up Visit Note  CC: Katherina Mires, MD  Katherina Mires, MD    ICD-10-CM   1. 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 Left Breast / 40.05 Gy in 15 fractions 2. Boost / 10 Gy in 5 fractions  Narrative:  The patient returns today for routine 1 month follow-up. Her interval history is generally unremarkable. She continues on anastrozole under Dr. Lindi Adie with severe hot flashes.  On review of systems, she reports fatigue, but is unable to separate radiation-related fatigue and her baseline fatigue. She denies pain. She is finishing up the Radiaplex she has and will use vitamin E cream after.  ALLERGIES:  is allergic to shellfish-derived products.  Meds: Current Outpatient Medications  Medication Sig Dispense Refill  . anastrozole (ARIMIDEX) 1 MG tablet TAKE 1 TABLET BY MOUTH EVERY DAY 90 tablet 2  . aspirin EC 81 MG tablet Take 81 mg by mouth daily.    . Azelaic Acid 15 % cream Apply topically.    . Calcium Citrate 200 MG TABS Take 500 mg by mouth.    . Cholecalciferol (VITAMIN D3) 1000 UNITS CAPS Take 2,000 Units by mouth.    . Coenzyme Q10 (COQ10) 100 MG CAPS Take 100 mg by mouth.    . Cyanocobalamin (CVS VITAMIN B-12) 5000 MCG SUBL Place under the tongue.    . docusate sodium (COLACE) 100 MG capsule Take 100 mg by mouth 2 (two) times daily.     Marland Kitchen escitalopram (LEXAPRO) 10 MG tablet TAKE 1 TABLET BY MOUTH EVERY DAY 90 tablet 0  . fluticasone (FLONASE) 50 MCG/ACT nasal spray Place 1 spray into the nose.    Marland Kitchen glucose blood test strip CHECK BLOOD SUGAR DAILY.    Marland Kitchen  lisinopril (PRINIVIL,ZESTRIL) 10 MG tablet Take 20 mg by mouth.     . Magnesium 400 MG CAPS Take by mouth.    . Melatonin 5 MG TABS Take 5 mg by mouth at bedtime.    . metFORMIN (GLUCOPHAGE) 500 MG tablet Take by mouth daily with breakfast.    . metroNIDAZOLE (METROCREAM) 0.75 % cream Apply topically.    . Omega-3 Fatty Acids (FISH OIL) 1000 MG CAPS Take 1 capsule by mouth.    Marland Kitchen OVER THE COUNTER MEDICATION alphalopoeic acid daily    . OVER THE COUNTER MEDICATION Take 1 tablet by mouth at bedtime. 5-HTP Plus (Natrol)    . OVER THE COUNTER MEDICATION Take 1 tablet by mouth at bedtime. Ashwaganda root.    . SUMAtriptan (IMITREX) 100 MG tablet 1 po immediately at onset of migraine; may repeat in 2 hrs if HA persists; no more than 2 in 24 hrs    . TURMERIC PO Take by mouth.    Marland Kitchen UNABLE TO FIND Take 2 capsules by mouth daily. CINSULIN    . vitamin C (ASCORBIC ACID) 500 MG tablet Take 500 mg by mouth.     Current Facility-Administered Medications  Medication Dose Route Frequency Provider Last Rate Last Dose  . lidocaine (PF) (XYLOCAINE) 1 % injection 0.3 mL  0.3 mL Other  Once Magnus Sinning, MD        Physical Findings: The patient is in no acute distress. Patient is alert and oriented.  height is _0  (1.727 m) and weight is 268 lb 8 oz (121.8 kg). Her temporal temperature is 98.7 F (37.1 C). Her blood pressure is 136/64 and her pulse is 67. Her respiration is 18 and oxygen saturation is 97%. .  No significant changes. Lungs are clear to auscultation bilaterally. Heart has regular rate and rhythm. No palpable cervical, supraclavicular, or axillary adenopathy. Abdomen soft, non-tender, normal bowel sounds. Left Breast: patient's skin is well healed, with some mild edema, no dominant mass is appreciated, no nipple discharge or bleeding. Right Breast: in the inframammary fold, she has erythematous rash consistent with a yeast infection.  Lab Findings: Lab Results  Component Value Date   WBC  9.4 06/15/2018   HGB 14.9 06/15/2018   HCT 43.2 06/15/2018   MCV 88.0 06/15/2018   PLT 251 06/15/2018    Radiographic Findings: No results found.  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 She continues to have problems with significant hot flashes and night sweats, which she has had and has worsened since being on hormone therapy. Patient has been given antifungal powder for her yeast infection in the right inframammary fold (opposite side of her breast cancer).  Plan:  PRN follow up in radiation oncology. She will continue close follow up with medical oncology and continue on hormonal therapy. She will let us know if the rash along the right inframammary fold does not clear up with antifungal power.  ____________________________________ Gery Pray, MD   This document serves as a record of services personally performed by Gery Pray, MD. It was created on his behalf by Wilburn Mylar, a trained medical scribe. The creation of this record is based on the scribe's personal observations and the provider's statements to them. This document has been checked and approved by the attending provider.

## 2019-01-05 NOTE — Progress Notes (Signed)
Pt presents today for f/u with Dr. Sondra Come. Pt is unable to describe/quantify fatigue as she reports she was fatigued prior to radiation. Pt denies c/o pain. Pt is finishing up Radiaplex on breast. Pt to start using vitamin E cream after Radiaplex is complete. Breast with fading hyperpigmentation.   BP 136/64 (BP Location: Right Arm, Patient Position: Sitting)   Pulse 67   Temp 98.7 F (37.1 C) (Temporal)   Resp 18   Ht 5\' 8"  (1.727 m)   Wt 268 lb 8 oz (121.8 kg)   SpO2 97%   BMI 40.83 kg/m   Wt Readings from Last 3 Encounters:  01/05/19 268 lb 8 oz (121.8 kg)  11/21/18 268 lb 1.6 oz (121.6 kg)  08/22/18 267 lb (121.1 kg)   Loma Sousa, RN BSN

## 2019-02-08 ENCOUNTER — Telehealth: Payer: Self-pay | Admitting: Adult Health

## 2019-02-08 NOTE — Telephone Encounter (Signed)
I left a message regarding schedule  

## 2019-03-01 ENCOUNTER — Telehealth: Payer: Self-pay | Admitting: Adult Health

## 2019-03-01 NOTE — Telephone Encounter (Signed)
Left message for patient to verify mychart visit for pre reg °

## 2019-03-02 ENCOUNTER — Encounter: Payer: Self-pay | Admitting: Adult Health

## 2019-03-02 ENCOUNTER — Inpatient Hospital Stay: Payer: Medicare Other | Attending: Hematology and Oncology | Admitting: Adult Health

## 2019-03-02 DIAGNOSIS — E2839 Other primary ovarian failure: Secondary | ICD-10-CM

## 2019-03-02 DIAGNOSIS — Z170 Estrogen receptor positive status [ER+]: Secondary | ICD-10-CM | POA: Diagnosis not present

## 2019-03-02 DIAGNOSIS — C50412 Malignant neoplasm of upper-outer quadrant of left female breast: Secondary | ICD-10-CM

## 2019-03-02 NOTE — Progress Notes (Signed)
SURVIVORSHIP VIRTUAL VISIT:  I connected with Linda Li on 03/02/19 at 10:00 AM EDT by my chart video and verified that I am speaking with the correct person using two identifiers.  I discussed the limitations, risks, security and privacy concerns of performing an evaluation and management service virtually and the availability of in person appointments. I also discussed with the patient that there may be a patient responsible charge related to this service. The patient expressed understanding and agreed to proceed.   BRIEF ONCOLOGIC HISTORY:  Oncology History  Malignant neoplasm of upper-outer quadrant of left breast in female, estrogen receptor positive ([place])  06/13/2018 Initial Diagnosis   Screening detected left breast asymmetry with distortion, ultrasound 8 mm at 12 o'clock position, axilla negative, ultrasound-guided biopsy revealed grade 1 IDC with DCIS ER 100%, PR 100%, HER-2 -1+ by IHC, Ki-67 3%, T1BN0 stage Ia clinical stage   07/08/2018 Surgery   Left lumpectomy: Grade 2 IDC, 1.3 cm, margins negative, negative for lymphovascular or perineural invasion, ER 100%, PR 90%, HER-2 -1+, Ki-67 3%, T1CNX stage Ia    07/20/2018 Cancer Staging   Staging form: Breast, AJCC 8th Edition - Pathologic: Stage IA (pT1c, pN0, cM0, G2, ER+, PR+, HER2-) - Signed by Gardenia Phlegm, NP on 07/20/2018   07/25/2018 Oncotype testing   Oncotype DX recurrence score 4, distant recurrence risk at 9 years: 3%, low risk   08/23/2018 -  Anti-estrogen oral therapy   Anastrozole 1 mg daily   11/01/2018 - 11/30/2018 Radiation Therapy   Adjuvant radiation therapy: 1. Left Breast / 40.05 Gy in 15 fractions 2. Boost / 10 Gy in 5 fractions     INTERVAL HISTORY:  Linda Li to review her survivorship care plan detailing her treatment course for breast cancer, as well as monitoring long-term side effects of that treatment, education regarding health maintenance, screening, and overall wellness and health  promotion.     Overall, Linda Li reports doing moderately well.  She says that she hates the anastrozole.  She is experiencing hot flashes and notes that she is having several a day.  She denies having any at night, but during the day, it happens and is worse when she feels hot, and then it is made worse by hot flashes.  She was experiencing hot flashes before her breast cancer diagnosis, and was taking Lexapro, which helped initially, however since starting the anastrozole it isn't helping.  She has tried a pine bark extract which hasn't helped, she is taking Gabapentin at bedtime, she hasn't found yams to see if that could help.  She denies any arthralgias, or vaginal dryness.     REVIEW OF SYSTEMS:  Review of Systems  Constitutional: Negative for appetite change, chills, fatigue, fever and unexpected weight change.  HENT:   Negative for hearing loss, lump/mass, sore throat and trouble swallowing.   Eyes: Negative for eye problems and icterus.  Respiratory: Negative for chest tightness, cough and shortness of breath.   Cardiovascular: Negative for chest pain, leg swelling and palpitations.  Gastrointestinal: Negative for abdominal distention, abdominal pain, constipation, diarrhea, nausea and vomiting.  Endocrine: Positive for hot flashes.  Genitourinary: Negative for difficulty urinating.   Musculoskeletal: Negative for arthralgias.  Skin: Negative for itching and rash.  Neurological: Negative for dizziness, extremity weakness, headaches and numbness.  Hematological: Negative for adenopathy.  Psychiatric/Behavioral: Negative for depression. The patient is not nervous/anxious.    Breast: Denies any new nodularity, masses, tenderness, nipple changes, or nipple discharge.  ONCOLOGY TREATMENT TEAM:  1. Surgeon:  Dr. Dalbert Batman at [facility] 2. Medical Oncologist: Dr. Lindi Adie  3. Radiation Oncologist: Dr. Sondra Come    PAST MEDICAL/SURGICAL HISTORY:  Past Medical History:   Diagnosis Date  . Arthritis   . Breast cancer ([place]) 06/2018   left  . Diabetes mellitus ([place])   . Diverticulosis   . Hypertension   . Migraines   . Obstructive sleep apnea    pressure is 14  . Rosacea   . Spinal stenosis    Past Surgical History:  Procedure Laterality Date  . BREAST LUMPECTOMY WITH RADIOACTIVE SEED LOCALIZATION Left 07/08/2018   Procedure: LEFT BREAST LUMPECTOMY WITH RADIOACTIVE SEED LOCALIZATION;  Surgeon: Fanny Skates, MD;  Location: [facility];  Service: General;  Laterality: Left;  . COLONOSCOPY    . KNEE ARTHROSCOPY     right torn meniscus  . SHOULDER ARTHROSCOPY     left torn ligament  . TONSILLECTOMY       ALLERGIES:  Allergies  Allergen Reactions  . Shellfish-Derived Products     Mouth swelling, throat burns, stomach pain     CURRENT MEDICATIONS:  Outpatient Encounter Medications as of 03/02/2019  Medication Sig Note  . anastrozole (ARIMIDEX) 1 MG tablet TAKE 1 TABLET BY MOUTH EVERY DAY   . aspirin EC 81 MG tablet Take 81 mg by mouth daily.   . Azelaic Acid 15 % cream Apply topically. 07/03/2014: Received from: [place]  . Calcium Citrate 200 MG TABS Take 500 mg by mouth. 07/03/2014: Received from: [place]  . Cholecalciferol (VITAMIN D3) 1000 UNITS CAPS Take 2,000 Units by mouth. 07/03/2014: Received from: [place]  . Coenzyme Q10 (COQ10) 100 MG CAPS Take 100 mg by mouth. 07/03/2014: Received from: [place]  . Cyanocobalamin (CVS VITAMIN B-12) 5000 MCG SUBL Place under the tongue.   . docusate sodium (COLACE) 100 MG capsule Take 100 mg by mouth 2 (two) times daily.  07/03/2014: Received from: [place]  . escitalopram (LEXAPRO) 10 MG tablet TAKE 1 TABLET BY MOUTH EVERY DAY   . fluticasone (FLONASE) 50 MCG/ACT nasal spray Place 1 spray into the nose. 07/03/2014: Received from: [place]  . glucose blood test strip CHECK BLOOD SUGAR DAILY. 07/03/2014: Received from: [place]  . lisinopril  (PRINIVIL,ZESTRIL) 10 MG tablet Take 20 mg by mouth.  07/03/2014: Received from: [place]  . Magnesium 400 MG CAPS Take by mouth. 07/03/2014: Received from: [place]  . Melatonin 5 MG TABS Take 5 mg by mouth at bedtime.   . metFORMIN (GLUCOPHAGE) 500 MG tablet Take by mouth daily with breakfast.   . metroNIDAZOLE (METROCREAM) 0.75 % cream Apply topically. 07/03/2014: Received from: [place]  . Omega-3 Fatty Acids (FISH OIL) 1000 MG CAPS Take 1 capsule by mouth. 07/03/2014: Received from: [place]  . OVER THE COUNTER MEDICATION alphalopoeic acid daily   . OVER THE COUNTER MEDICATION Take 1 tablet by mouth at bedtime. 5-HTP Plus (Natrol)   . OVER THE COUNTER MEDICATION Take 1 tablet by mouth at bedtime. Ashwaganda root.   . SUMAtriptan (IMITREX) 100 MG tablet 1 po immediately at onset of migraine; may repeat in 2 hrs if HA persists; no more than 2 in 24 hrs 07/03/2014: Received from: [facility]  . TURMERIC PO Take by mouth.   Marland Kitchen UNABLE TO FIND Take 2 capsules by mouth daily. CINSULIN   . vitamin C (ASCORBIC ACID) 500 MG tablet Take 500 mg by mouth. 07/03/2014: Received from:  [facility]   Facility-Administered Encounter Medications as of 03/02/2019  Medication  . lidocaine (PF) (XYLOCAINE) 1 % injection 0.3 mL     ONCOLOGIC FAMILY HISTORY:  Family History  Problem Relation Age of Onset  . Throat cancer Father   . Colon cancer Neg Hx      GENETIC COUNSELING/TESTING: Not at this time  SOCIAL HISTORY:  Social History   Socioeconomic History  . Marital status: Married    Spouse name: Not on file  . Number of children: Not on file  . Years of education: Not on file  . Highest education level: Not on file  Occupational History  . Not on file  Social Needs  . Financial resource strain: Not on file  . Food insecurity    Worry: Not on file    Inability: Not on file  . Transportation needs    Medical: Not on file    Non-medical: Not on file  Tobacco Use  .  Smoking status: Never Smoker  . Smokeless tobacco: Never Used  Substance and Sexual Activity  . Alcohol use: No    Alcohol/week: 0.0 standard drinks  . Drug use: No  . Sexual activity: Not on file  Lifestyle  . Physical activity    Days per week: Not on file    Minutes per session: Not on file  . Stress: Not on file  Relationships  . Social Herbalist on phone: Not on file    Gets together: Not on file    Attends religious service: Not on file    Active member of club or organization: Not on file    Attends meetings of clubs or organizations: Not on file    Relationship status: Not on file  . Intimate partner violence    Fear of current or ex partner: Not on file    Emotionally abused: Not on file    Physically abused: Not on file    Forced sexual activity: Not on file  Other Topics Concern  . Not on file  Social History Narrative  . Not on file     OBSERVATIONS/OBJECTIVE:  Patient appears well.  In no apparent distress.  Breathing is non labored.  Mood and behavior are normal. Skin visualized without rash or lesion.  LABORATORY DATA:  None for this visit.  DIAGNOSTIC IMAGING:  None for this visit.      ASSESSMENT AND PLAN:  Ms.. Tapanes is a pleasant 73 y.o. female with Stage IA left breast invasive ductal carcinoma, ER+/PR+/HER2-, diagnosed in 05/2018, treated with lumpectomy, adjuvant radiation therapy, and anti-estrogen therapy with Anastrozole beginning in 08/2018.  She presents to the Survivorship Clinic for our initial meeting and routine follow-up post-completion of treatment for breast cancer.    1. Stage IA left breast cancer:  Ms. Goers is continuing to recover from definitive treatment for breast cancer. She will follow-up with her medical oncologist, Dr. Lindi Adie in  with history and physical exam per surveillance protocol.  She will continue her anti-estrogen therapy with Anastrozole. Thus far, she is tolerating the Anastrozole moderately well,  see #2. She was instructed to make Dr. Lindi Adie or myself aware if she begins to experience any worsening side effects of the medication and I could see her back in clinic to help manage those side effects, as needed. Her mammogram is due 05/2019; orders placed today.   Today, a comprehensive survivorship care plan and treatment summary was reviewed with the patient today detailing her breast  cancer diagnosis, treatment course, potential late/long-term effects of treatment, appropriate follow-up care with recommendations for the future, and patient education resources.  A copy of this summary, along with a letter will be sent to the patient's primary care provider via mail/fax/In Basket message after today's visit.    2. Hot Flashes: I suggested she try acupuncutre, and my nurse is getting her this information.  3. Bone health:  Given Ms. Pellow's age/history of breast cancer and her current treatment regimen including anti-estrogen therapy with Anastrozole, she is at risk for bone demineralization.  Her last DEXA scan was 07/02/2015, which showed osteopenia with a T score of -2.1 in the left forearm.  She is due for repeat bone density.  We discussed bisphosphanate therapy today as well.  I placed orders for bone density testing when she undergoes her mammogram.  In the meantime, she was encouraged to increase her consumption of foods rich in calcium, as well as increase her weight-bearing activities.  She was given education on specific activities to promote bone health.  4. Cancer screening:  Due to Ms. Pro's history and her age, she should receive screening for skin cancers, colon cancer, and gynecologic cancers.  The information and recommendations are listed on the patient's comprehensive care plan/treatment summary and were reviewed in detail with the patient.    5. Health maintenance and wellness promotion: Ms. Begnaud was encouraged to consume 5-7 servings of fruits and vegetables per day. We  reviewed the "Nutrition Rainbow" handout, as well as the handout "Take Control of Your Health and Reduce Your Cancer Risk" from the [place].  She was also encouraged to engage in moderate to vigorous exercise for 30 minutes per day most days of the week. We discussed the [employer] fitness program, which is designed for cancer survivors to help them become more physically fit after cancer treatments.  She was instructed to limit her alcohol consumption and continue to abstain from tobacco use.     6. Support services/counseling: It is not uncommon for this period of the patient's cancer care trajectory to be one of many emotions and stressors.  We discussed how this can be increasingly difficult during the times of quarantine and social distancing due to the COVID-19 pandemic.   She was given information regarding our available services and encouraged to contact me with any questions or for help enrolling in any of our support group/programs.    Follow up instructions:    -Return to cancer center in 06/2019 for f/u with Dr. Lindi Adie  -Mammogram due in 05/2019 -Bone density due in 05/2019 -She is welcome to return back to the Survivorship Clinic at any time; no additional follow-up needed at this time.  -Consider referral back to survivorship as a long-term survivor for continued surveillance  The patient was provided an opportunity to ask questions and all were answered. The patient agreed with the plan and demonstrated an understanding of the instructions.   The patient was advised to call back or seek an in-person evaluation if the symptoms worsen or if the condition fails to improve as anticipated.   I provided 25 minutes of face-to-face video visit time during this encounter, and > 50% was spent counseling as documented under my assessment & plan.  Scot Dock, NP

## 2019-03-13 ENCOUNTER — Other Ambulatory Visit: Payer: Self-pay | Admitting: Hematology and Oncology

## 2019-04-10 ENCOUNTER — Encounter: Payer: Self-pay | Admitting: Gastroenterology

## 2019-04-22 IMAGING — MG NEEDLE LOCALIZATION OF THE LEFT BREAST WITH MAMMO GUIDANCE
8 series · 8 of 8 positions shown · non-contrast
Comparison: Previous exam(s).

CLINICAL DATA: Preoperative localization prior to left lumpectomy.

EXAM:
MAMMOGRAPHIC GUIDED RADIOACTIVE SEED LOCALIZATION OF THE LEFT BREAST

[L LM (1 of 4)]
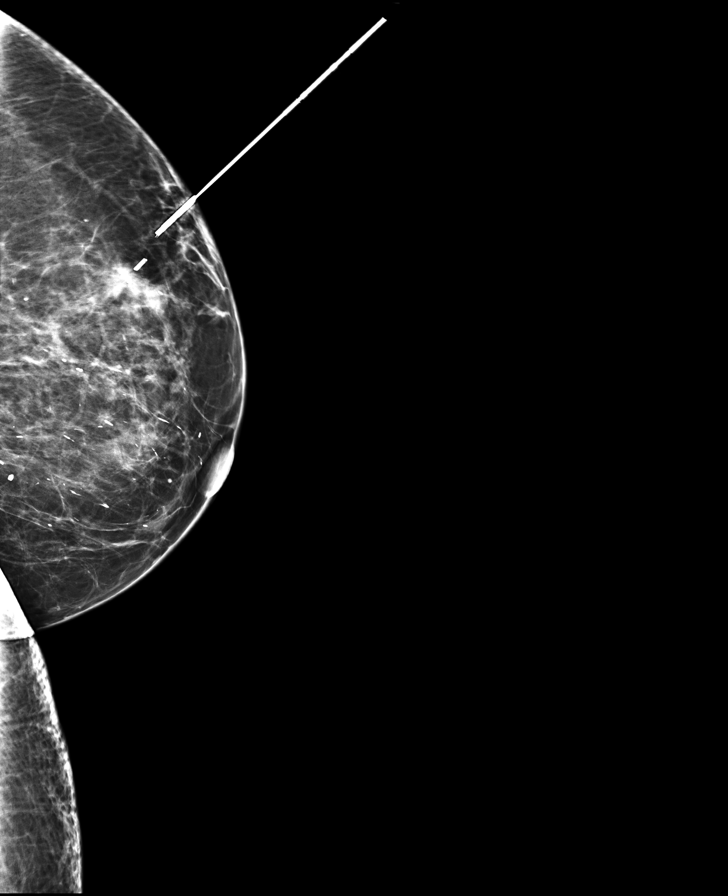

[L CC (1 of 4)]
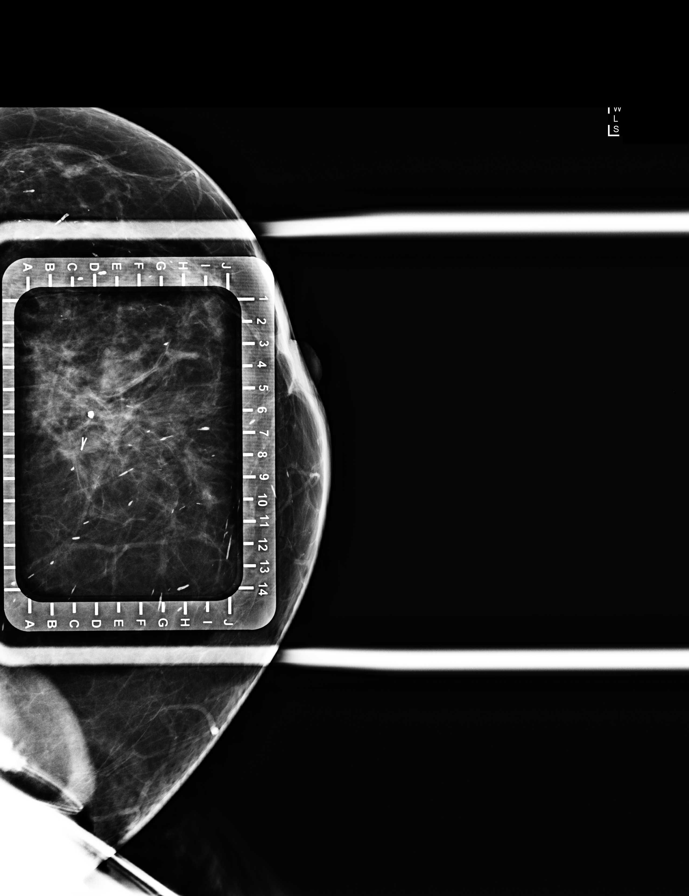

[L LM (2 of 4)]
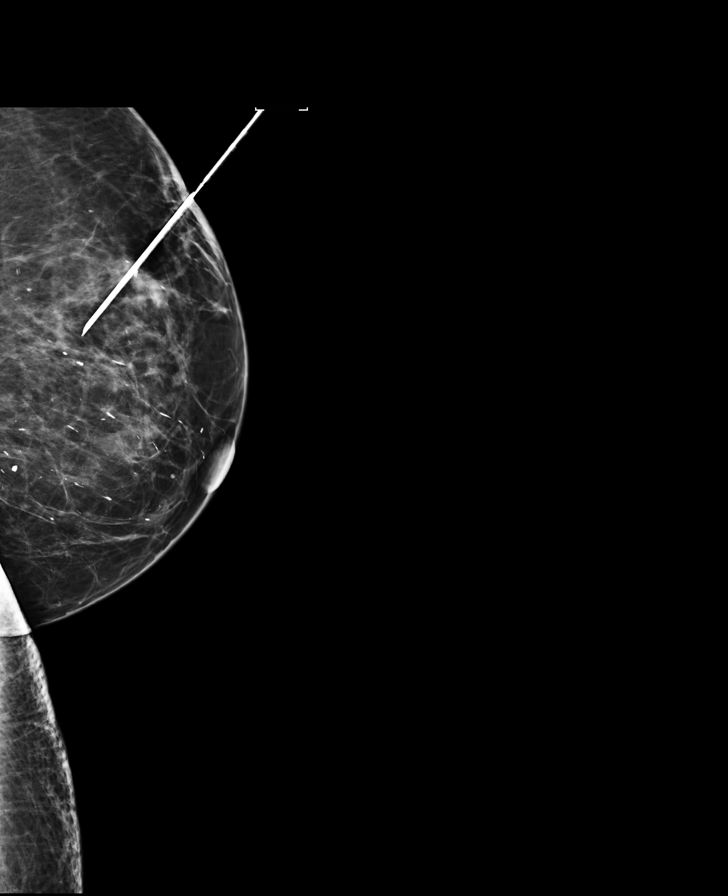

[L CC (2 of 4)]
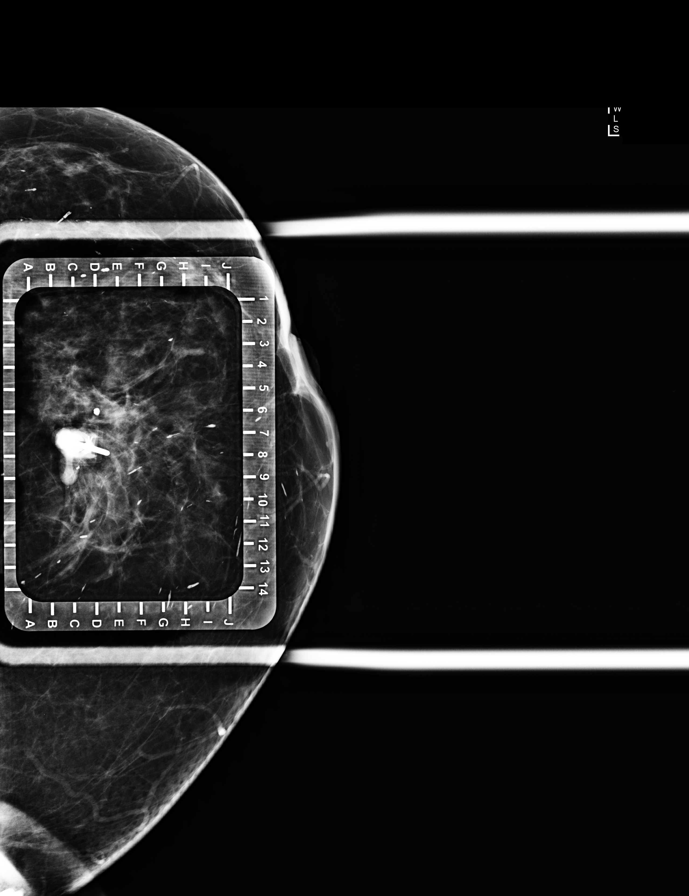

[L LM (3 of 4)]
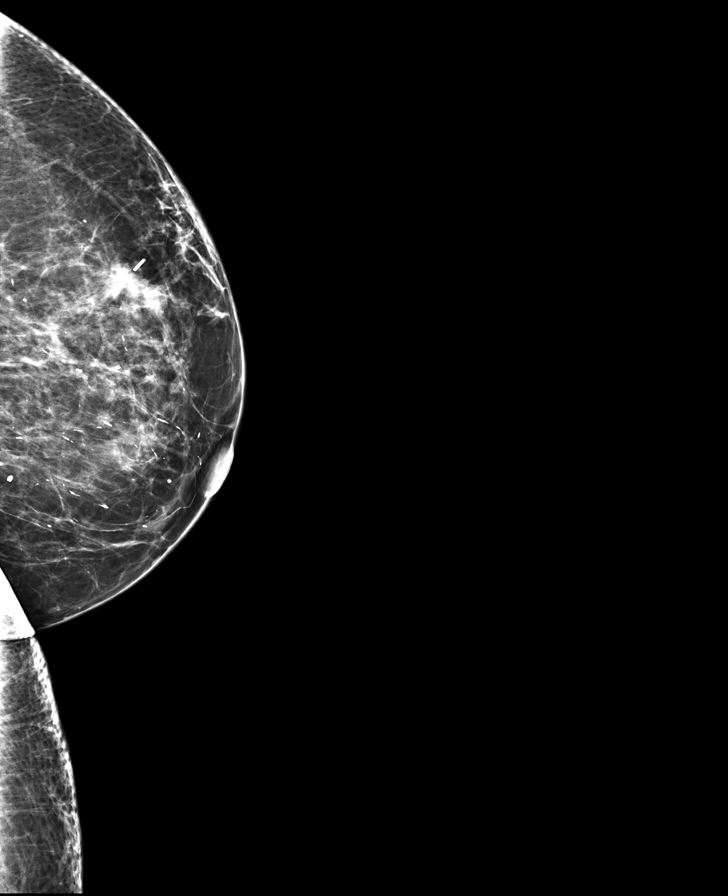

[L CC (3 of 4)]
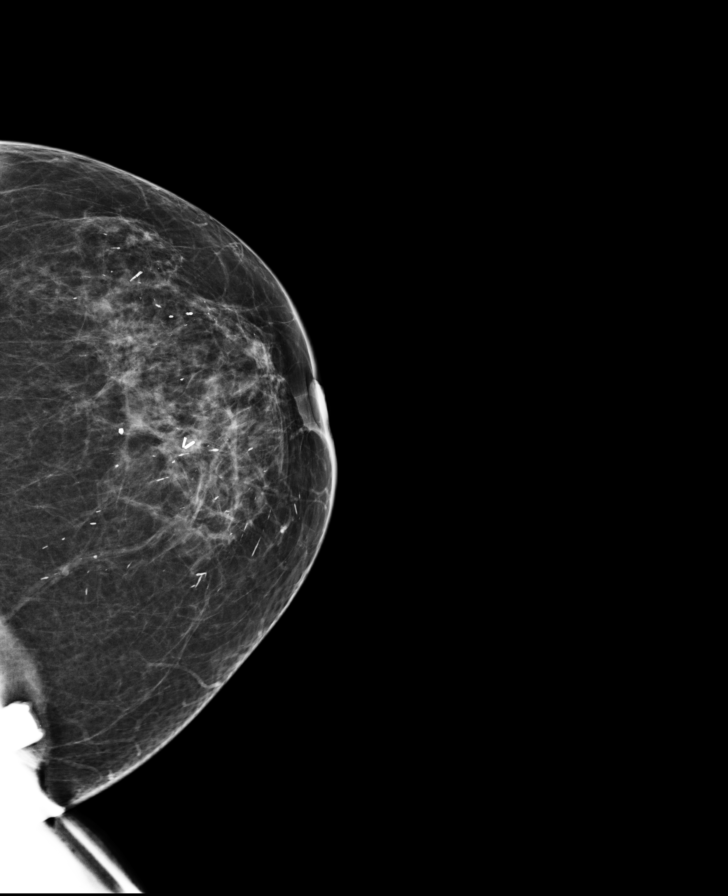

[L CC (4 of 4)]
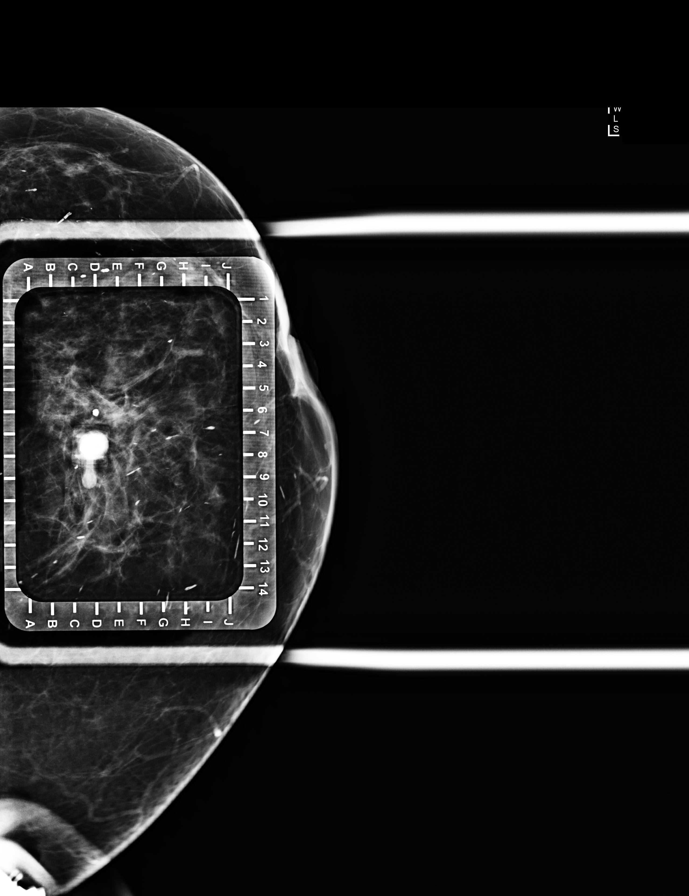

[L LM (4 of 4)]
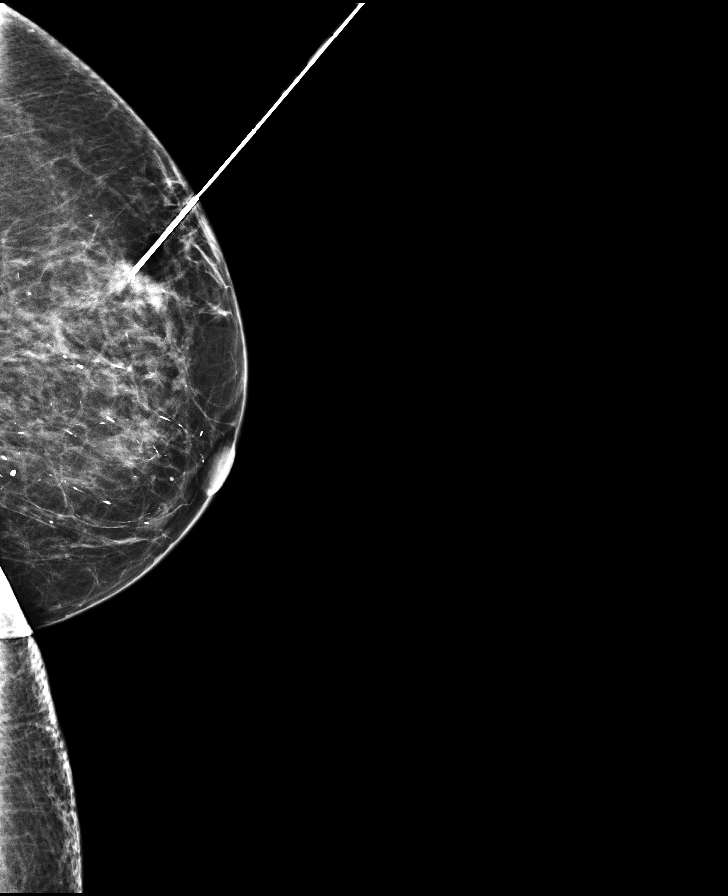

[8 of 8 positions shown; findings below may reference images not displayed]

FINDINGS: Patient presents for radioactive seed localization prior to left
lumpectomy. I met with the patient and we discussed the procedure of
seed localization including benefits and alternatives. We discussed
the high likelihood of a successful procedure. We discussed the
risks of the procedure including infection, bleeding, tissue injury
and further surgery. We discussed the low dose of radioactivity
involved in the procedure. Informed, written consent was given.

The usual time-out protocol was performed immediately prior to the
procedure.

Using mammographic guidance, sterile technique, 1% lidocaine and an
U-5AC radioactive seed, ribbon shaped marking clip was localized
using a cranial approach. The follow-up mammogram images confirm the
seed in the expected location and were marked for Dr. De Rauw.

Follow-up survey of the patient confirms presence of the radioactive
seed.

Order number of U-5AC seed:  [id number].

Total activity:  0.254 millicuries reference Date: 05/27/2018

The patient tolerated the procedure well and was released from the
[REDACTED]. She was given instructions regarding seed removal.
IMPRESSION: Radioactive seed localization left breast. No apparent
complications.

## 2019-05-02 ENCOUNTER — Ambulatory Visit: Payer: Self-pay

## 2019-05-02 ENCOUNTER — Other Ambulatory Visit: Payer: Self-pay

## 2019-05-02 ENCOUNTER — Ambulatory Visit (INDEPENDENT_AMBULATORY_CARE_PROVIDER_SITE_OTHER): Payer: Medicare Other | Admitting: Orthopaedic Surgery

## 2019-05-02 ENCOUNTER — Encounter: Payer: Self-pay | Admitting: Orthopaedic Surgery

## 2019-05-02 VITALS — Ht 68.0 in | Wt 265.0 lb

## 2019-05-02 DIAGNOSIS — M79645 Pain in left finger(s): Secondary | ICD-10-CM | POA: Diagnosis not present

## 2019-05-02 DIAGNOSIS — M79642 Pain in left hand: Secondary | ICD-10-CM

## 2019-05-02 MED ORDER — LIDOCAINE HCL (PF) 1 % IJ SOLN
1.0000 mL | INTRAMUSCULAR | Status: AC | PRN
  Administered 2019-05-02: 15:00:00 1 mL

## 2019-05-02 MED ORDER — METHYLPREDNISOLONE ACETATE 40 MG/ML IJ SUSP
40.0000 mg | INTRAMUSCULAR | Status: AC | PRN
  Administered 2019-05-02: 15:00:00 40 mg

## 2019-05-02 NOTE — Progress Notes (Signed)
Office Visit Note   Patient: Linda Li           Date of Birth: 03-Feb-1946           MRN: [id number] Visit Date: 05/02/2019              Requested by: Katherina Mires, MD [address] [place],  [place] [address] PCP: Katherina Mires, MD   Assessment & Plan: Visit Diagnoses:  1. Pain in left hand   2. Thumb pain, left    Chronic pain at the base of the left thumb i.e. carpometacarpal joint.  Having difficulty with grip with localized tenderness.  Films today demonstrate significant arthritis at the carpometacarpal joint with narrowing of the joint space, subchondral sclerosis and partial subluxation.  Long discussion regarding the diagnosis and treatment options.  Will inject with cortisone and apply a soft Freedom splint.  Discussed use of Voltaren gel  Follow-Up Instructions: Return if symptoms worsen or fail to improve.   Orders:  Orders Placed This Encounter  Procedures  . Hand/UE Inj: L thumb CMC  . XR Hand Complete Left   No orders of the defined types were placed in this encounter.     Procedures: Hand/UE Inj: L thumb CMC for osteoarthritis on 05/02/2019 3:14 PM Details: 27 G needle, dorsal approach Medications: 1 mL lidocaine (PF) 1 %; 40 mg methylPREDNISolone acetate 40 MG/ML      Clinical Data: No additional findings.   Subjective: Chief Complaint  Patient presents with  . Left Hand - Pain  Patient presents today with left hand pain. She said that it has really started hurting more in the last couple months. The pain is located around the first metacarpal area. No swelling. She said that she has arthritis in her wrist. The pain is present when using her hand. She takes Tumeric and Glucosamine.  HPI  Review of Systems  Constitutional: Negative for fatigue.  HENT: Negative for ear pain.   Eyes: Negative for pain.  Respiratory: Negative for shortness of breath.   Cardiovascular: Negative for leg swelling.  Gastrointestinal:  Negative for constipation and diarrhea.  Endocrine: Negative for cold intolerance and heat intolerance.  Genitourinary: Negative for difficulty urinating.  Musculoskeletal: Negative for joint swelling.  Skin: Negative for rash.  Allergic/Immunologic: Positive for food allergies.  Neurological: Negative for weakness.  Hematological: Does not bruise/bleed easily.  Psychiatric/Behavioral: Negative for sleep disturbance.     Objective: Vital Signs: Ht 5\' 8"  (1.727 m)   Wt 265 lb (120.2 kg)   BMI 40.29 kg/m   Physical Exam Constitutional:      Appearance: She is well-developed.  Eyes:     Pupils: Pupils are equal, round, and reactive to light.  Pulmonary:     Effort: Pulmonary effort is normal.  Skin:    General: Skin is warm and dry.  Neurological:     Mental Status: She is alert and oriented to person, place, and time.  Psychiatric:        Behavior: Behavior normal.     Ortho Exam positive grind test and pain at the base of the left thumb.  Negative Finkelstein's test.  Partial subluxation of the metacarpal on the carpal bone.  Skin intact.  Neurologically intact.  Decreased grip strength  Specialty Comments:  No specialty comments available.  Imaging: XR Hand Complete Left  Result Date: 05/02/2019 Films of the left thumb were obtained in several projections.  There is significant degenerative arthritis at  the base of the thumb at the carpometacarpal joint.  There is partial subluxation and ectopic bone with decrease in the joint space and subchondral sclerosis on both sides of the joint.  No acute change    PMFS History: Patient Active Problem List   Diagnosis Date Noted  . Thumb pain, left 05/02/2019  . Malignant neoplasm of upper-outer quadrant of left breast in female, estrogen receptor positive ([facility]) 06/13/2018   Past Medical History:  Diagnosis Date  . Arthritis   . Breast cancer ([facility]) 06/2018   left  . Diabetes mellitus ([facility])   . Diverticulosis   .  Hypertension   . Migraines   . Obstructive sleep apnea    pressure is 14  . Rosacea   . Spinal stenosis     Family History  Problem Relation Age of Onset  . Throat cancer Father   . Colon cancer Neg Hx     Past Surgical History:  Procedure Laterality Date  . BREAST LUMPECTOMY WITH RADIOACTIVE SEED LOCALIZATION Left 07/08/2018   Procedure: LEFT BREAST LUMPECTOMY WITH RADIOACTIVE SEED LOCALIZATION;  Surgeon: Fanny Skates, MD;  Location: [facility];  Service: General;  Laterality: Left;  . COLONOSCOPY    . KNEE ARTHROSCOPY     right torn meniscus  . SHOULDER ARTHROSCOPY     left torn ligament  . TONSILLECTOMY     Social History   Occupational History  . Not on file  Tobacco Use  . Smoking status: Never Smoker  . Smokeless tobacco: Never Used  Substance and Sexual Activity  . Alcohol use: No    Alcohol/week: 0.0 standard drinks  . Drug use: No  . Sexual activity: Not on file

## 2019-05-03 ENCOUNTER — Other Ambulatory Visit: Payer: Self-pay

## 2019-05-03 ENCOUNTER — Encounter: Payer: Self-pay | Admitting: Gastroenterology

## 2019-05-03 ENCOUNTER — Ambulatory Visit (AMBULATORY_SURGERY_CENTER): Payer: Medicare Other | Admitting: *Deleted

## 2019-05-03 VITALS — Temp 96.6°F | Ht 68.0 in | Wt 264.0 lb

## 2019-05-03 DIAGNOSIS — Z1159 Encounter for screening for other viral diseases: Secondary | ICD-10-CM

## 2019-05-03 DIAGNOSIS — Z86010 Personal history of colonic polyps: Secondary | ICD-10-CM

## 2019-05-03 MED ORDER — NA SULFATE-K SULFATE-MG SULF 17.5-3.13-1.6 GM/177ML PO SOLN: 1.0000 | Freq: Once | ORAL | 0 refills | Status: AC

## 2019-05-03 NOTE — Progress Notes (Signed)

## 2019-05-09 ENCOUNTER — Other Ambulatory Visit: Payer: Self-pay | Admitting: Hematology and Oncology

## 2019-05-09 NOTE — Telephone Encounter (Signed)
Please see refill.

## 2019-05-10 ENCOUNTER — Telehealth: Payer: Self-pay | Admitting: Gastroenterology

## 2019-05-10 NOTE — Telephone Encounter (Signed)
Pt is asking if she can take Generic Miralax instead of Colace daily prior to her colon- Instructed yes- she can use the Miralax every day until her prep day with no issues- she verbalized understanding

## 2019-05-10 NOTE — Telephone Encounter (Signed)
Pt requested a call back to discuss prep.  She stated that "it says to take a stool softener" and pt does not know which one to take.

## 2019-05-15 ENCOUNTER — Other Ambulatory Visit: Payer: Self-pay | Admitting: Gastroenterology

## 2019-05-15 ENCOUNTER — Ambulatory Visit (INDEPENDENT_AMBULATORY_CARE_PROVIDER_SITE_OTHER): Payer: Medicare Other

## 2019-05-15 DIAGNOSIS — Z1159 Encounter for screening for other viral diseases: Secondary | ICD-10-CM

## 2019-05-15 LAB — SARS CORONAVIRUS 2 (TAT 6-24 HRS): SARS Coronavirus 2: NEGATIVE

## 2019-05-17 ENCOUNTER — Other Ambulatory Visit: Payer: Self-pay

## 2019-05-17 ENCOUNTER — Ambulatory Visit (AMBULATORY_SURGERY_CENTER): Payer: Medicare Other | Admitting: Gastroenterology

## 2019-05-17 ENCOUNTER — Encounter: Payer: Self-pay | Admitting: Gastroenterology

## 2019-05-17 VITALS — BP 128/55 | HR 62 | Temp 97.7°F | Resp 14 | Ht 68.0 in | Wt 264.0 lb

## 2019-05-17 DIAGNOSIS — D122 Benign neoplasm of ascending colon: Secondary | ICD-10-CM

## 2019-05-17 DIAGNOSIS — D123 Benign neoplasm of transverse colon: Secondary | ICD-10-CM

## 2019-05-17 DIAGNOSIS — Z86010 Personal history of colonic polyps: Secondary | ICD-10-CM

## 2019-05-17 DIAGNOSIS — D120 Benign neoplasm of cecum: Secondary | ICD-10-CM

## 2019-05-17 MED ORDER — SODIUM CHLORIDE 0.9 % IV SOLN: 500.0000 mL | Freq: Once | INTRAVENOUS | Status: DC

## 2019-05-17 NOTE — Progress Notes (Signed)
Temp-JB VS-CW  Pt's states no medical or surgical changes since previsit or office visit.  

## 2019-05-17 NOTE — Progress Notes (Signed)
Called to room to assist during endoscopic procedure.  Patient ID and intended procedure confirmed with present staff. Received instructions for my participation in the procedure from the performing physician.  

## 2019-05-17 NOTE — Progress Notes (Signed)
Report to PACU, RN, vss, BBS= Clear.  

## 2019-05-17 NOTE — Op Note (Signed)
[facility] Patient Name: Linda Li Procedure Date: 05/17/2019 11:59 AM MRN: [id number] Endoscopist: Remo Lipps P. Havery Moros , MD Age: 73 Referring MD:  Date of Birth: 10-31-1945 Gender: Female Account #: [id number] Procedure:                Colonoscopy Indications:              Surveillance: Personal history of 6 adenomatous                            polyps on last colonoscopy 01/2015 Medicines:                Monitored Anesthesia Care Procedure:                Pre-Anesthesia Assessment:                           - Prior to the procedure, a History and Physical                            was performed, and patient medications and                            allergies were reviewed. The patient's tolerance of                            previous anesthesia was also reviewed. The risks                            and benefits of the procedure and the sedation                            options and risks were discussed with the patient.                            All questions were answered, and informed consent                            was obtained. Prior Anticoagulants: The patient has                            taken no previous anticoagulant or antiplatelet                            agents. ASA Grade Assessment: III - A patient with                            severe systemic disease. After reviewing the risks                            and benefits, the patient was deemed in                            satisfactory condition to undergo the procedure.  After obtaining informed consent, the colonoscope                            was passed under direct vision. Throughout the                            procedure, the patient's blood pressure, pulse, and                            oxygen saturations were monitored continuously. The                            Colonoscope was introduced through the anus and                            advanced to the  the cecum, identified by                            appendiceal orifice and ileocecal valve. The                            colonoscopy was performed without difficulty. The                            patient tolerated the procedure well. The quality                            of the bowel preparation was adequate. The                            ileocecal valve, appendiceal orifice, and rectum                            were photographed. Scope In: 12:03:28 PM Scope Out: 12:39:22 PM Scope Withdrawal Time: 0 hours 28 minutes 32 seconds  Total Procedure Duration: 0 hours 35 minutes 54 seconds  Findings:                 The perianal and digital rectal examinations were                            normal.                           Two sessile polyps were found in the cecum. The                            polyps were 3 to 12 mm in size. The largest polyp                            was noted near the AO but did not invade it. These                            polyps were removed with a cold snare, largest  removed in piecemeal. Resection and retrieval were                            complete.                           Four sessile polyps were found in the ascending                            colon. The polyps were 3 to 4 mm in size. These                            polyps were removed with a cold snare. Resection                            and retrieval were complete.                           A 4 mm polyp was found in the transverse colon. The                            polyp was sessile. The polyp was removed with a                            cold snare. Resection and retrieval were complete.                           Multiple medium-mouthed diverticula were found in                            the transverse colon and left colon.                           Internal hemorrhoids were found during retroflexion.                           The colon was tortous with looping.  The exam was                            otherwise without abnormality. Complications:            No immediate complications. Estimated blood loss:                            Minimal. Estimated Blood Loss:     Estimated blood loss was minimal. Impression:               - Two 3 to 12 mm polyps in the cecum, removed with                            a cold snare. Resected and retrieved.                           - Four 3 to 4 mm polyps in the ascending colon,  removed with a cold snare. Resected and retrieved.                           - One 4 mm polyp in the transverse colon, removed                            with a cold snare. Resected and retrieved.                           - Diverticulosis in the transverse colon and in the                            left colon.                           - Internal hemorrhoids.                           - The examination was otherwise normal. Recommendation:           - Patient has a contact number available for                            emergencies. The signs and symptoms of potential                            delayed complications were discussed with the                            patient. Return to normal activities tomorrow.                            Written discharge instructions were provided to the                            patient.                           - Resume previous diet.                           - Continue present medications.                           - Await pathology results. Remo Lipps P. Jazzy Parmer, MD 05/17/2019 12:48:41 PM This report has been signed electronically.

## 2019-05-17 NOTE — Patient Instructions (Signed)
Handouts given on Polyps, Diverticulosis, and Hemorrhoids.  YOU HAD AN ENDOSCOPIC PROCEDURE TODAY AT [facility]:   Refer to the procedure report that was given to you for any specific questions about what was found during the examination.  If the procedure report does not answer your questions, please call your gastroenterologist to clarify.  If you requested that your care partner not be given the details of your procedure findings, then the procedure report has been included in a sealed envelope for you to review at your convenience later.  YOU SHOULD EXPECT: Some feelings of bloating in the abdomen. Passage of more gas than usual.  Walking can help get rid of the air that was put into your GI tract during the procedure and reduce the bloating. If you had a lower endoscopy (such as a colonoscopy or flexible sigmoidoscopy) you may notice spotting of blood in your stool or on the toilet paper. If you underwent a bowel prep for your procedure, you may not have a normal bowel movement for a few days.  Please Note:  You might notice some irritation and congestion in your nose or some drainage.  This is from the oxygen used during your procedure.  There is no need for concern and it should clear up in a day or so.  SYMPTOMS TO REPORT IMMEDIATELY:   Following lower endoscopy (colonoscopy or flexible sigmoidoscopy):  Excessive amounts of blood in the stool  Significant tenderness or worsening of abdominal pains  Swelling of the abdomen that is new, acute  Fever of 100F or higher   For urgent or emergent issues, a gastroenterologist can be reached at any hour by calling [phone].   DIET:  We do recommend a small meal at first, but then you may proceed to your regular diet.  Drink plenty of fluids but you should avoid alcoholic beverages for 24 hours.  ACTIVITY:  You should plan to take it easy for the rest of today and you should NOT DRIVE or use heavy machinery until  tomorrow (because of the sedation medicines used during the test).    FOLLOW UP: Our staff will call the number listed on your records 48-72 hours following your procedure to check on you and address any questions or concerns that you may have regarding the information given to you following your procedure. If we do not reach you, we will leave a message.  We will attempt to reach you two times.  During this call, we will ask if you have developed any symptoms of COVID 19. If you develop any symptoms (ie: fever, flu-like symptoms, shortness of breath, cough etc.) before then, please call [phone].  If you test positive for Covid 19 in the 2 weeks post procedure, please call and report this information to [place].    If any biopsies were taken you will be contacted by phone or by letter within the next 1-3 weeks.  Please call us at [phone] if you have not heard about the biopsies in 3 weeks.    SIGNATURES/CONFIDENTIALITY: You and/or your care partner have signed paperwork which will be entered into your electronic medical record.  These signatures attest to the fact that that the information above on your After Visit Summary has been reviewed and is understood.  Full responsibility of the confidentiality of this discharge information lies with you and/or your care-partner.

## 2019-05-22 ENCOUNTER — Telehealth: Payer: Self-pay | Admitting: *Deleted

## 2019-05-22 NOTE — Telephone Encounter (Signed)
  Follow up Call-  Call back number 05/17/2019  Post procedure Call Back phone  # Linda Li  Permission to leave phone message Yes  Some recent data might be hidden     Patient questions:  Do you have a fever, pain , or abdominal swelling? No. Pain Score  0 *  Have you tolerated food without any problems? Yes.    Have you been able to return to your normal activities? Yes.    Do you have any questions about your discharge instructions: Diet   No. Medications  No. Follow up visit  No.  Do you have questions or concerns about your Care? Yes- asked about when she should have a repeat colonoscopy. RN informed pt that MD would determine that timeframe based on pathology results from polyps. RN informed patient that pathology results can take up to 7-10 days at least and that she would receive a letter in the mail with results. Patient verbalized understanding.  Actions: * If pain score is 4 or above: No action needed, pain <4.  1. Have you developed a fever since your procedure? no  2.   Have you had an respiratory symptoms (SOB or cough) since your procedure? no  3.   Have you tested positive for COVID 19 since your procedure no  4.   Have you had any family members/close contacts diagnosed with the COVID 19 since your procedure?  no   If yes to any of these questions please route to Joylene John, RN and Alphonsa Gin, Therapist, sports.

## 2019-05-22 NOTE — Telephone Encounter (Signed)
Attempted f/u phone call. No answer. Left message. °

## 2019-06-01 ENCOUNTER — Ambulatory Visit: Payer: Self-pay

## 2019-06-01 ENCOUNTER — Ambulatory Visit: Payer: Medicare Other | Admitting: Orthopaedic Surgery

## 2019-06-01 ENCOUNTER — Other Ambulatory Visit: Payer: Self-pay

## 2019-06-01 ENCOUNTER — Encounter: Payer: Self-pay | Admitting: Orthopaedic Surgery

## 2019-06-01 VITALS — Ht 68.0 in | Wt 264.0 lb

## 2019-06-01 DIAGNOSIS — S52532A Colles' fracture of left radius, initial encounter for closed fracture: Secondary | ICD-10-CM | POA: Diagnosis not present

## 2019-06-01 DIAGNOSIS — S52552A Other extraarticular fracture of lower end of left radius, initial encounter for closed fracture: Secondary | ICD-10-CM | POA: Diagnosis not present

## 2019-06-01 DIAGNOSIS — S52502A Unspecified fracture of the lower end of left radius, initial encounter for closed fracture: Secondary | ICD-10-CM | POA: Insufficient documentation

## 2019-06-01 DIAGNOSIS — M79642 Pain in left hand: Secondary | ICD-10-CM

## 2019-06-01 NOTE — Progress Notes (Signed)
Office Visit Note   Patient: Linda Li           Date of Birth: January 04, 1946           MRN: [id number] Visit Date: 06/01/2019              Requested by: Katherina Mires, MD [address] [place],  [place] [address] PCP: Katherina Mires, MD   Assessment & Plan: Visit Diagnoses:  1. Pain in left hand   2. Closed Colles' fracture of left radius, initial encounter   3. Other closed extra-articular fracture of distal end of left radius, initial encounter     Plan:  #1: Closed reduction of the left distal radius. Ulnar gutter splint  Follow-Up Instructions: No follow-ups on file.   Orders:  Orders Placed This Encounter  Procedures  . XR Wrist Complete Left  . XR Hand Complete Left  . XR Wrist 2 Views Left   No orders of the defined types were placed in this encounter.     Procedures: Casting  Date/Time: 06/01/2019 5:40 PM Performed by: Cherylann Ratel, PA-C Authorized by: Cherylann Ratel, PA-C   Consent Given by:  Patient Site marked: the procedure site was marked   Timeout: prior to procedure the correct patient, procedure, and site was verified   Location:  Wrist  left wrist Dislocation Type: distal radioulnar   Fracture Type: distal radius   Manipulation Performed?: Yes   Skin Traction Used?: No   Skeletal Traction Used?: Yes   Reduction Successful?: Yes   Confirmation: reduction confirmed by x-ray   Immobilization: sugar tong splint. Supplies Used:  Fiberglass Distal Perfusion: normal   Distal Sensation: normal   Range of Motion: unchanged   Patient tolerance:  Patient tolerated the procedure well with no immediate complications  Comments: The patient was placed in supine position on the exam table.  An intraosseous injection was performed with Xylocaine.  This was a hematoma block.  Once anesthesia was present patient was placed into finger traps with 5 to 7 pounds of weight over the distal lower humerus area.  A closed manipulative  reduction was performed of the distal radius.  A sugar tong splint was then placed.  Post reduction x-ray reveals improvement of the fracture of her.  Tolerated the procedure well.  Good capillary refill.  Sensations intact post reduction.     Clinical Data: No additional findings.   Subjective: Chief Complaint  Patient presents with  . Left Wrist - Injury    DOI 06/01/2019  HPI: Patient presents today for left wrist pain. She was vacuuming today at 11am and tripped over the cord. She landed on her left wrist. She has pain throughout her and wrist area. It is swollen and bruised. She took Aspirin. She said that any movement causes pain. She has been applying ice.    Review of Systems  Review of Systems  Constitutional: Negative for fatigue.  HENT: Negative for ear pain.   Eyes: Negative for pain.  Respiratory: Negative for shortness of breath.   Cardiovascular: Negative for leg swelling.  Gastrointestinal: Negative for constipation and diarrhea.  Endocrine: Negative for cold intolerance and heat intolerance.  Genitourinary: Negative for difficulty urinating.  Musculoskeletal: Negative for joint swelling.  Skin: Negative for rash.  Allergic/Immunologic: Positive for food allergies.  Neurological: Negative for weakness.  Hematological: Does not bruise/bleed easily.  Psychiatric/Behavioral: Negative for sleep disturbance Breast cancer   Objective: Vital Signs: Ht 5\' 8"  (  1.727 m)   Wt 264 lb (119.7 kg)   BMI 40.14 kg/m   Physical Exam Constitutional:      Appearance: Normal appearance. She is well-developed. She is obese.  HENT:     Head: Normocephalic.  Eyes:     Pupils: Pupils are equal, round, and reactive to light.  Pulmonary:     Effort: Pulmonary effort is normal.  Skin:    General: Skin is warm and dry.  Neurological:     Mental Status: She is alert and oriented to person, place, and time.  Psychiatric:        Mood and Affect: Mood normal.        Behavior:  Behavior normal.        Thought Content: Thought content normal.        Judgment: Judgment normal.     Ortho Exam  Exam today reveals tenderness to palpation over the distal radius.  May be some swelling in the area of the distal radius difficult to tell for sure secondary to her exogenous obesity.  Got good radial pulse.  Good capillary refill.  Able to make a fist and straighten the hand and fingers.  Sensations intact to light touch.  Specialty Comments:  No specialty comments available.  Imaging: No results found.   PMFS History: Current Outpatient Medications  Medication Sig Dispense Refill  . anastrozole (ARIMIDEX) 1 MG tablet TAKE 1 TABLET BY MOUTH EVERY DAY 270 tablet 0  . aspirin EC 81 MG tablet Take 81 mg by mouth daily.    . Azelaic Acid 15 % cream Apply topically.    . Calcium Citrate 200 MG TABS Take 500 mg by mouth.    . Cholecalciferol (VITAMIN D3) 1000 UNITS CAPS Take 2,000 Units by mouth.    . Coenzyme Q10 (COQ10) 100 MG CAPS Take 100 mg by mouth.    . Cyanocobalamin ([employer] VITAMIN B-12) 5000 MCG SUBL Place under the tongue.    . docusate sodium (COLACE) 100 MG capsule Take 100 mg by mouth 2 (two) times daily.     Marland Kitchen escitalopram (LEXAPRO) 10 MG tablet TAKE 1 TABLET BY MOUTH EVERY DAY 90 tablet 0  . gabapentin (NEURONTIN) 300 MG capsule Take 300 mg by mouth at bedtime.    Marland Kitchen glucose blood test strip CHECK BLOOD SUGAR DAILY.    Marland Kitchen lisinopril (PRINIVIL,ZESTRIL) 10 MG tablet Take 20 mg by mouth.     . Magnesium 400 MG CAPS Take by mouth.    . metFORMIN (GLUCOPHAGE) 500 MG tablet Take by mouth daily with breakfast.    . metFORMIN (GLUCOPHAGE-XR) 500 MG 24 hr tablet Take 500 mg by mouth every morning.    . montelukast (SINGULAIR) 10 MG tablet Take 10 mg by mouth daily.    . Omega-3 Fatty Acids (FISH OIL) 1000 MG CAPS Take 1 capsule by mouth.    Marland Kitchen OVER THE COUNTER MEDICATION alphalopoeic acid daily    . OVER THE COUNTER MEDICATION Take 1 tablet by mouth at bedtime. 5-HTP  Plus (Natrol)    . OVER THE COUNTER MEDICATION Take 1 tablet by mouth at bedtime. Ashwaganda root.    . SUMAtriptan (IMITREX) 100 MG tablet 1 po immediately at onset of migraine; may repeat in 2 hrs if HA persists; no more than 2 in 24 hrs    . TURMERIC PO Take by mouth.    Marland Kitchen UNABLE TO FIND Take 2 capsules by mouth daily. CINSULIN    . vitamin C (ASCORBIC ACID) 500  MG tablet Take 500 mg by mouth.     Current Facility-Administered Medications  Medication Dose Route Frequency Provider Last Rate Last Admin  . lidocaine (PF) (XYLOCAINE) 1 % injection 0.3 mL  0.3 mL Other Once Magnus Sinning, MD        Patient Active Problem List   Diagnosis Date Noted  . Closed fracture of left distal radius 06/01/2019  . Thumb pain, left 05/02/2019  . Malignant neoplasm of upper-outer quadrant of left breast in female, estrogen receptor positive ([facility]) 06/13/2018   Past Medical History:  Diagnosis Date  . Arthritis   . Breast cancer ([facility]) 06/2018   left  . Diabetes mellitus ([facility])   . Diverticulosis   . Hyperlipidemia   . Hypertension   . Migraines   . Obstructive sleep apnea    pressure is 14  . Osteoporosis   . Rosacea   . Sleep apnea    daily c-pap  . Spinal stenosis     Family History  Problem Relation Age of Onset  . Throat cancer Father   . Esophageal cancer Father   . Colon cancer Neg Hx   . Rectal cancer Neg Hx   . Stomach cancer Neg Hx     Past Surgical History:  Procedure Laterality Date  . BREAST LUMPECTOMY WITH RADIOACTIVE SEED LOCALIZATION Left 07/08/2018   Procedure: LEFT BREAST LUMPECTOMY WITH RADIOACTIVE SEED LOCALIZATION;  Surgeon: Fanny Skates, MD;  Location: [facility];  Service: General;  Laterality: Left;  . COLONOSCOPY    . KNEE ARTHROSCOPY     right torn meniscus  . POLYPECTOMY    . SHOULDER ARTHROSCOPY     left torn ligament  . TONSILLECTOMY     Social History   Occupational History  . Not on file  Tobacco Use  . Smoking status: Never  Smoker  . Smokeless tobacco: Never Used  Substance and Sexual Activity  . Alcohol use: No    Alcohol/week: 0.0 standard drinks  . Drug use: No  . Sexual activity: Not on file

## 2019-06-02 ENCOUNTER — Other Ambulatory Visit: Payer: Medicare Other

## 2019-06-05 ENCOUNTER — Encounter: Payer: Self-pay | Admitting: Orthopedic Surgery

## 2019-06-05 ENCOUNTER — Ambulatory Visit: Payer: Self-pay

## 2019-06-05 ENCOUNTER — Other Ambulatory Visit: Payer: Self-pay

## 2019-06-05 ENCOUNTER — Ambulatory Visit: Payer: Medicare Other | Admitting: Orthopedic Surgery

## 2019-06-05 DIAGNOSIS — M25531 Pain in right wrist: Secondary | ICD-10-CM | POA: Diagnosis not present

## 2019-06-05 NOTE — Progress Notes (Signed)
   Post-Op Visit Note   Patient: Linda Li           Date of Birth: 1945/10/01           MRN: [id number] Visit Date: 06/05/2019 PCP: Katherina Mires, MD   Assessment & Plan:  Chief Complaint: No chief complaint on file.  Visit Diagnoses:  1. Pain in right wrist     Plan: Montina comes in today now 4 days out from left wrist closed reduction for distal radius fracture.  The splint was not functioning in its desired role to stabilize the wrist.  It is removed.  Ecchymosis and bruising is present.  Grip strength intact.  Radiographs show fairly minimal change in dorsal tilt of the fracture.  5 degrees at most.  Do not anticipate this becoming an operative problem.  Short arm cast applied today.  Follow-up in 2 days with Aaron Edelman or Dr. Durward Fortes.  The swelling has decreased enough that we can safely put on a short arm cast which should be sufficient.  Follow-Up Instructions: No follow-ups on file.   Orders:  Orders Placed This Encounter  Procedures  . XR Wrist 2 Views Left   No orders of the defined types were placed in this encounter.   Imaging: XR Wrist 2 Views Left  Result Date: 06/05/2019 AP lateral left wrist reviewed.  Dorsal distal radius fracture is present relatively unchanged in position alignment compared to 4 days ago.  Radial height and inclination maintained.  Tilt is increased at most 5 degrees.  Dorsally.   PMFS History: Patient Active Problem List   Diagnosis Date Noted  . Closed fracture of left distal radius 06/01/2019  . Thumb pain, left 05/02/2019  . Malignant neoplasm of upper-outer quadrant of left breast in female, estrogen receptor positive ([facility]) 06/13/2018   Past Medical History:  Diagnosis Date  . Arthritis   . Breast cancer ([facility]) 06/2018   left  . Diabetes mellitus ([facility])   . Diverticulosis   . Hyperlipidemia   . Hypertension   . Migraines   . Obstructive sleep apnea    pressure is 14  . Osteoporosis   . Rosacea   . Sleep apnea    daily  c-pap  . Spinal stenosis     Family History  Problem Relation Age of Onset  . Throat cancer Father   . Esophageal cancer Father   . Colon cancer Neg Hx   . Rectal cancer Neg Hx   . Stomach cancer Neg Hx     Past Surgical History:  Procedure Laterality Date  . BREAST LUMPECTOMY WITH RADIOACTIVE SEED LOCALIZATION Left 07/08/2018   Procedure: LEFT BREAST LUMPECTOMY WITH RADIOACTIVE SEED LOCALIZATION;  Surgeon: Fanny Skates, MD;  Location: [facility];  Service: General;  Laterality: Left;  . COLONOSCOPY    . KNEE ARTHROSCOPY     right torn meniscus  . POLYPECTOMY    . SHOULDER ARTHROSCOPY     left torn ligament  . TONSILLECTOMY     Social History   Occupational History  . Not on file  Tobacco Use  . Smoking status: Never Smoker  . Smokeless tobacco: Never Used  Substance and Sexual Activity  . Alcohol use: No    Alcohol/week: 0.0 standard drinks  . Drug use: No  . Sexual activity: Not on file

## 2019-06-07 ENCOUNTER — Other Ambulatory Visit: Payer: Self-pay

## 2019-06-07 ENCOUNTER — Ambulatory Visit: Payer: Self-pay

## 2019-06-07 ENCOUNTER — Encounter: Payer: Self-pay | Admitting: Orthopaedic Surgery

## 2019-06-07 ENCOUNTER — Ambulatory Visit: Payer: Medicare Other | Admitting: Orthopaedic Surgery

## 2019-06-07 VITALS — Ht 68.0 in | Wt 264.0 lb

## 2019-06-07 DIAGNOSIS — S52552A Other extraarticular fracture of lower end of left radius, initial encounter for closed fracture: Secondary | ICD-10-CM

## 2019-06-07 NOTE — Progress Notes (Signed)
Office Visit Note   Patient: Linda Li           Date of Birth: 05-23-1945           MRN: [id number] Visit Date: 06/07/2019              Requested by: Katherina Mires, MD [address],  [place] [address] PCP: Katherina Mires, MD   Assessment & Plan: Visit Diagnoses:  1. Other closed extra-articular fracture of distal end of left radius, initial encounter     Plan: 6 days status post minimally displaced left distal radius fracture predominately dorsally.  Saw Dr. Marlou Sa 2 days ago when she was having some trouble with a long-arm splint knee applied a short arm cast.  Repeat x-rays were performed which demonstrated very slight dorsal tilting of the distal radius but maintain length.  We will continue with nonoperative treatment and have her return in 1 week.  Also at the time of her fall was having a little trouble with some pain in the area of her left ribs but no shortness of breath or chest pain.  She could have either bruised her rib or possibly even a small undisplaced fracture  Follow-Up Instructions: Return in about 1 week (around 06/14/2019).   Orders:  No orders of the defined types were placed in this encounter.  No orders of the defined types were placed in this encounter.     Procedures: No procedures performed   Clinical Data: No additional findings.   Subjective: Chief Complaint  Patient presents with  . Left Wrist - Follow-up    DOI 06/01/2019  Patient is following up today for her left wrist fracture. She fell on 06/01/2019 and was seen in the office. She was placed into a splint. She returned to the office and saw Dr.Dean two days ago. She was taken out of the splint and placed into a short arm cast. She said that her her wrist is feeling okay, but having pain in her left ribs from the fall. She generally does not take anything for pain unless needed, and then she uses over the counter medicine.   HPI  Review of  Systems   Objective: Vital Signs: Ht 5\' 8"  (1.727 m)   Wt 264 lb (119.7 kg)   BMI 40.14 kg/m   Physical Exam  Ortho Exam no swelling of her left hand.  Able to make a full fist neurologically intact.  Short arm cast intact.  Specialty Comments:  No specialty comments available.  Imaging: No results found.   PMFS History: Patient Active Problem List   Diagnosis Date Noted  . Closed fracture of left distal radius 06/01/2019  . Thumb pain, left 05/02/2019  . Malignant neoplasm of upper-outer quadrant of left breast in female, estrogen receptor positive ([facility]) 06/13/2018   Past Medical History:  Diagnosis Date  . Arthritis   . Breast cancer ([facility]) 06/2018   left  . Diabetes mellitus ([facility])   . Diverticulosis   . Hyperlipidemia   . Hypertension   . Migraines   . Obstructive sleep apnea    pressure is 14  . Osteoporosis   . Rosacea   . Sleep apnea    daily c-pap  . Spinal stenosis     Family History  Problem Relation Age of Onset  . Throat cancer Father   . Esophageal cancer Father   . Colon cancer Neg Hx   . Rectal cancer Neg Hx   .  Stomach cancer Neg Hx     Past Surgical History:  Procedure Laterality Date  . BREAST LUMPECTOMY WITH RADIOACTIVE SEED LOCALIZATION Left 07/08/2018   Procedure: LEFT BREAST LUMPECTOMY WITH RADIOACTIVE SEED LOCALIZATION;  Surgeon: Fanny Skates, MD;  Location: [facility];  Service: General;  Laterality: Left;  . COLONOSCOPY    . KNEE ARTHROSCOPY     right torn meniscus  . POLYPECTOMY    . SHOULDER ARTHROSCOPY     left torn ligament  . TONSILLECTOMY     Social History   Occupational History  . Not on file  Tobacco Use  . Smoking status: Never Smoker  . Smokeless tobacco: Never Used  Substance and Sexual Activity  . Alcohol use: No    Alcohol/week: 0.0 standard drinks  . Drug use: No  . Sexual activity: Not on file

## 2019-06-12 ENCOUNTER — Other Ambulatory Visit: Payer: Self-pay

## 2019-06-12 ENCOUNTER — Ambulatory Visit
Admission: RE | Admit: 2019-06-12 | Discharge: 2019-06-12 | Disposition: A | Discharge status: Home / Self Care | Payer: Medicare Other | Source: Ambulatory Visit | Attending: Adult Health | Admitting: Adult Health

## 2019-06-12 DIAGNOSIS — Z170 Estrogen receptor positive status [ER+]: Secondary | ICD-10-CM

## 2019-06-12 DIAGNOSIS — C50412 Malignant neoplasm of upper-outer quadrant of left female breast: Secondary | ICD-10-CM

## 2019-06-13 ENCOUNTER — Other Ambulatory Visit: Payer: Self-pay

## 2019-06-13 ENCOUNTER — Ambulatory Visit: Payer: Self-pay

## 2019-06-13 ENCOUNTER — Encounter: Payer: Self-pay | Admitting: Orthopaedic Surgery

## 2019-06-13 ENCOUNTER — Ambulatory Visit (INDEPENDENT_AMBULATORY_CARE_PROVIDER_SITE_OTHER): Payer: Medicare Other | Admitting: Orthopaedic Surgery

## 2019-06-13 VITALS — Ht 68.0 in | Wt 264.0 lb

## 2019-06-13 DIAGNOSIS — S52552A Other extraarticular fracture of lower end of left radius, initial encounter for closed fracture: Secondary | ICD-10-CM | POA: Diagnosis not present

## 2019-06-13 NOTE — Progress Notes (Signed)
Office Visit Note   Patient: Linda Li           Date of Birth: 04-26-1946           MRN: [id number] Visit Date: 06/13/2019              Requested by: Katherina Mires, MD [address],  [place] [address] PCP: Katherina Mires, MD   Assessment & Plan: Visit Diagnoses:  1. Other closed extra-articular fracture of distal end of left radius, initial encounter     Plan: 2 weeks status post left distal radius fracture and doing well in short arm cast.  No change in position of films.  Will reevaluate in 2 weeks with repeat films out of the short arm cast  Follow-Up Instructions: Return in about 2 weeks (around 06/27/2019).   Orders:  Orders Placed This Encounter  Procedures  . XR Wrist 2 Views Left   No orders of the defined types were placed in this encounter.     Procedures: No procedures performed   Clinical Data: No additional findings.   Subjective: Chief Complaint  Patient presents with  . Left Wrist - Follow-up    DOI 06/01/2019  Patient presents today for a 6 day follow up on her left wrist. Her injury was on 06/01/2019 and is now 12 days out from injury. She is wearing a short arm cast. Patient states that her pain is about the same.  Was experiencing some left rib pain that seems to have resolved.  No shortness of breath or chest pain  HPI  Review of Systems   Objective: Vital Signs: Ht 5\' 8"  (1.727 m)   Wt 264 lb (119.7 kg)   BMI 40.14 kg/m   Physical Exam  Ortho Exam short arm cast intact the left upper extremity.  No swelling of digits.  Neurologically intact.  No pain  Specialty Comments:  No specialty comments available.  Imaging: MM DIAG BREAST TOMO BILATERAL  Result Date: 06/12/2019 CLINICAL DATA:  History of left breast cancer status post lumpectomy in February of 2020. EXAM: DIGITAL DIAGNOSTIC BILATERAL MAMMOGRAM WITH CAD AND TOMO COMPARISON:  Previous exam(s). ACR Breast Density Category b: There are  scattered areas of fibroglandular density. FINDINGS: Lumpectomy changes are seen in the left breast. No suspicious mass or malignant type microcalcifications identified in either breast. Mammographic images were processed with CAD. IMPRESSION: No evidence of malignancy in either breast. RECOMMENDATION: Bilateral diagnostic mammogram in 1 year is recommended. I have discussed the findings and recommendations with the patient. If applicable, a reminder letter will be sent to the patient regarding the next appointment. BI-RADS CATEGORY  2: Benign. Electronically Signed   By: Lillia Mountain M.D.   On: 06/12/2019 16:12   XR Wrist 2 Views Left  Result Date: 06/13/2019 Repeat films of the left wrist with obtained in the short arm cast.  There does not appear to be any significant change in position of the fracture from last week.  There is minimal loss of length of may be a millimeter of the distal radius and on the lateral view there is neutral to slight dorsiflexion of the fracture but no change in the last week    PMFS History: Patient Active Problem List   Diagnosis Date Noted  . Closed fracture of left distal radius 06/01/2019  . Thumb pain, left 05/02/2019  . Malignant neoplasm of upper-outer quadrant of left breast in female, estrogen receptor positive ([facility]) 06/13/2018  Past Medical History:  Diagnosis Date  . Arthritis   . Breast cancer ([facility]) 06/2018   left  . Diabetes mellitus ([facility])   . Diverticulosis   . Hyperlipidemia   . Hypertension   . Migraines   . Obstructive sleep apnea    pressure is 14  . Osteoporosis   . Personal history of radiation therapy 2020   Left breast   . Rosacea   . Sleep apnea    daily c-pap  . Spinal stenosis     Family History  Problem Relation Age of Onset  . Throat cancer Father   . Esophageal cancer Father   . Colon cancer Neg Hx   . Rectal cancer Neg Hx   . Stomach cancer Neg Hx     Past Surgical History:  Procedure Laterality Date  . BREAST  LUMPECTOMY Left 07/08/2018  . BREAST LUMPECTOMY WITH RADIOACTIVE SEED LOCALIZATION Left 07/08/2018   Procedure: LEFT BREAST LUMPECTOMY WITH RADIOACTIVE SEED LOCALIZATION;  Surgeon: Fanny Skates, MD;  Location: [facility];  Service: General;  Laterality: Left;  . COLONOSCOPY    . KNEE ARTHROSCOPY     right torn meniscus  . POLYPECTOMY    . SHOULDER ARTHROSCOPY     left torn ligament  . TONSILLECTOMY     Social History   Occupational History  . Not on file  Tobacco Use  . Smoking status: Never Smoker  . Smokeless tobacco: Never Used  Substance and Sexual Activity  . Alcohol use: No    Alcohol/week: 0.0 standard drinks  . Drug use: No  . Sexual activity: Not on file

## 2019-06-27 ENCOUNTER — Ambulatory Visit: Payer: Medicare Other | Admitting: Orthopaedic Surgery

## 2019-06-27 ENCOUNTER — Other Ambulatory Visit: Payer: Self-pay

## 2019-06-27 ENCOUNTER — Ambulatory Visit: Payer: Self-pay

## 2019-06-27 ENCOUNTER — Encounter: Payer: Self-pay | Admitting: Orthopaedic Surgery

## 2019-06-27 VITALS — Ht 68.0 in | Wt 264.0 lb

## 2019-06-27 DIAGNOSIS — G8929 Other chronic pain: Secondary | ICD-10-CM

## 2019-06-27 DIAGNOSIS — M545 Low back pain, unspecified: Secondary | ICD-10-CM

## 2019-06-27 DIAGNOSIS — Z6841 Body Mass Index (BMI) 40.0 and over, adult: Secondary | ICD-10-CM

## 2019-06-27 DIAGNOSIS — S52552A Other extraarticular fracture of lower end of left radius, initial encounter for closed fracture: Secondary | ICD-10-CM | POA: Diagnosis not present

## 2019-06-27 DIAGNOSIS — E669 Obesity, unspecified: Secondary | ICD-10-CM | POA: Insufficient documentation

## 2019-06-27 MED ORDER — TRAMADOL HCL 50 MG PO TABS: ORAL_TABLET | ORAL | 0 refills | Status: DC

## 2019-06-27 NOTE — Progress Notes (Signed)
Office Visit Note   Patient: Donsha Mehok           Date of Birth: May 29, 1945           MRN: [id number] Visit Date: 06/27/2019              Requested by: Katherina Mires, MD [address] [place],  [place] [address] PCP: Katherina Mires, MD   Assessment & Plan: Visit Diagnoses:  1. Other closed extra-articular fracture of distal end of left radius, initial encounter   2. Chronic left-sided low back pain, unspecified whether sciatica present     Plan: Left wrist fracture is healing without problems.  There is about 9 degree dorsal tilt on the fracture but abundant callus.  No obvious deformity clinically.  Will work on range of motion exercises and return in a week.  Apply volar wrist splint.  Reevaluate in 1 week and consider physical therapy.  Exacerbation of chronic low back pain.  Films demonstrate significant degenerative changes in the last several levels.  Had MRI scan in 2016 demonstrating the spondylolisthesis at L4-5 and moderate to severe central canal stenosis with bilateral subarticular zone narrowing which could affect either L5 nerve root there was also asymmetric left subarticular zone and foraminal zone narrowing at L5-S1 due to disc osteophyte complex bony overgrowth trace retrolisthesis and facet hypertrophy.  We will ask Dr. Ernestina Patches to evaluate and consider injections.  Tramadol for pain. Office visit over 40 minutes 50% of the time in counseling  Follow-Up Instructions: Return in about 10 days (around 07/07/2019).   Orders:  Orders Placed This Encounter  Procedures  . XR Wrist 2 Views Left  . XR Lumbar Spine 2-3 Views  . XR Pelvis 1-2 Views  . Ambulatory referral to Physical Medicine Rehab   No orders of the defined types were placed in this encounter.     Procedures: No procedures performed   Clinical Data: No additional findings.   Subjective: Chief Complaint  Patient presents with  . Left Wrist - Follow-up  Patient  presents today for a two week follow up on her left distal radius fracture. Her injury was on 06/01/2019, and she is almost four weeks out from it. She has been in a short arm cast.  She is doing well.  Patient also comes in today with complaints in her left lower buttock area. She has been hurting for 5 days. No new injury. She said that she has no groin pain. No numbness or tingling in her lower extremities. She cannot lay down without pain. She is taking over the counter pain medicine.   HPI  Review of Systems   Objective: Vital Signs: Ht 5\' 8"  (1.727 m)   Wt 264 lb (119.7 kg)   BMI 40.14 kg/m   Physical Exam Constitutional:      Appearance: She is well-developed.  Eyes:     Pupils: Pupils are equal, round, and reactive to light.  Pulmonary:     Effort: Pulmonary effort is normal.  Skin:    General: Skin is warm and dry.  Neurological:     Mental Status: She is alert and oriented to person, place, and time.  Psychiatric:        Behavior: Behavior normal.     Ortho Exam awake alert and oriented x3.  Comfortable sitting.  BMI 40.  Straight leg raise negative.  Some mild percussible tenderness of lower lumbar spine little more to the left than to  the right.  Painless range of motion both hips.  Left wrist with about 45 degrees of dorsiflexion and about 30 degrees of volar flexion.  Some discomfort over the distal ulna but not the radius.  No obvious deformity.  No radial deviation.  No swelling of the digits.  Neurologically intact.  Does lack about 30 degrees to full supination but Mrs. Hutchins relates that she may always have had some problem with that  Specialty Comments:  No specialty comments available.  Imaging: XR Lumbar Spine 2-3 Views  Result Date: 06/27/2019 2 views of the lumbar spine obtained in the AP lateral projections.  There is a grade 2 spondylolisthesis of L4 on 5.  Facet joint changes at L4-5 and L5-S1.  No obvious acute changes.  No scoliosis  XR Pelvis  1-2 Views  Result Date: 06/27/2019 AP the pelvis demonstrates very minimal narrowing of the left hip joint compared to the right but patient is asymptomatic in that area.  Narrowing of the symphysis pubis.  No acute changes  XR Wrist 2 Views Left  Result Date: 06/27/2019 Films of the left wrist were obtained in 2 projections.  There is callus about the distal radius fracture there is about a 9 degree dorsal tilt.  Possibly a nondisplaced fracture of the ulnar styloid.  Fracture is 37 month old    PMFS History: Patient Active Problem List   Diagnosis Date Noted  . Closed fracture of left distal radius 06/01/2019  . Thumb pain, left 05/02/2019  . Malignant neoplasm of upper-outer quadrant of left breast in female, estrogen receptor positive ([employer]) 06/13/2018   Past Medical History:  Diagnosis Date  . Arthritis   . Breast cancer ([facility]) 06/2018   left  . Diabetes mellitus ([facility])   . Diverticulosis   . Hyperlipidemia   . Hypertension   . Migraines   . Obstructive sleep apnea    pressure is 14  . Osteoporosis   . Personal history of radiation therapy 2020   Left breast   . Rosacea   . Sleep apnea    daily c-pap  . Spinal stenosis     Family History  Problem Relation Age of Onset  . Throat cancer Father   . Esophageal cancer Father   . Colon cancer Neg Hx   . Rectal cancer Neg Hx   . Stomach cancer Neg Hx     Past Surgical History:  Procedure Laterality Date  . BREAST LUMPECTOMY Left 07/08/2018  . BREAST LUMPECTOMY WITH RADIOACTIVE SEED LOCALIZATION Left 07/08/2018   Procedure: LEFT BREAST LUMPECTOMY WITH RADIOACTIVE SEED LOCALIZATION;  Surgeon: Fanny Skates, MD;  Location: [facility];  Service: General;  Laterality: Left;  . COLONOSCOPY    . KNEE ARTHROSCOPY     right torn meniscus  . POLYPECTOMY    . SHOULDER ARTHROSCOPY     left torn ligament  . TONSILLECTOMY     Social History   Occupational History  . Not on file  Tobacco Use  . Smoking status:  Never Smoker  . Smokeless tobacco: Never Used  Substance and Sexual Activity  . Alcohol use: No    Alcohol/week: 0.0 standard drinks  . Drug use: No  . Sexual activity: Not on file

## 2019-06-27 NOTE — Addendum Note (Signed)
Addended by: Lendon Collar on: 06/27/2019 03:06 PM   Modules accepted: Orders

## 2019-07-03 ENCOUNTER — Telehealth: Payer: Self-pay | Admitting: *Deleted

## 2019-07-03 NOTE — Telephone Encounter (Signed)
Received call from pt stating she is experiencing chest wall/ muscle pain around her left breast.  Pt questioning if this is a side effect from radiation.  Pt denies any recent trauma or injury to the left breast.  Per note from Wilber Bihari NP on 03/02/19 pt to be seen by MD in February of 2021.  Pt requesting to be seen this week for breast exam.  Apt scheduled apt pt verbalized understanding.

## 2019-07-06 ENCOUNTER — Ambulatory Visit: Payer: Medicare Other | Admitting: Physical Medicine and Rehabilitation

## 2019-07-06 ENCOUNTER — Ambulatory Visit: Payer: Medicare Other | Admitting: Orthopaedic Surgery

## 2019-07-06 ENCOUNTER — Inpatient Hospital Stay: Payer: Medicare Other | Admitting: Hematology and Oncology

## 2019-07-09 NOTE — Progress Notes (Signed)
 Patient Care Team: Briscoe, Kim K, MD as PCP - General (Family Medicine) Ingram, Haywood, MD as Consulting Physician (General Surgery) Gudena, Vinay, MD as Consulting Physician (Hematology and Oncology) Kinard, James, MD as Consulting Physician (Radiation Oncology)  DIAGNOSIS:    ICD-10-CM   1. Malignant neoplasm of upper-outer quadrant of left breast in female, estrogen receptor positive (HCC)  C50.412    Z17.0     SUMMARY OF ONCOLOGIC HISTORY: Oncology History  Malignant neoplasm of upper-outer quadrant of left breast in female, estrogen receptor positive (HCC)  06/13/2018 Initial Diagnosis   Screening detected left breast asymmetry with distortion, ultrasound 8 mm at 12 o'clock position, axilla negative, ultrasound-guided biopsy revealed grade 1 IDC with DCIS ER 100%, PR 100%, HER-2 -1+ by IHC, Ki-67 3%, T1BN0 stage Ia clinical stage   07/08/2018 Surgery   Left lumpectomy: Grade 2 IDC, 1.3 cm, margins negative, negative for lymphovascular or perineural invasion, ER 100%, PR 90%, HER-2 -1+, Ki-67 3%, T1CNX stage Ia    07/20/2018 Cancer Staging   Staging form: Breast, AJCC 8th Edition - Pathologic: Stage IA (pT1c, pN0, cM0, G2, ER+, PR+, HER2-) - Signed by Causey, Lindsey Cornetto, NP on 07/20/2018   07/25/2018 Oncotype testing   Oncotype DX recurrence score 4, distant recurrence risk at 9 years: 3%, low risk   08/23/2018 -  Anti-estrogen oral therapy   Anastrozole 1 mg daily   11/01/2018 - 11/30/2018 Radiation Therapy   Adjuvant radiation therapy: 1. Left Breast / 40.05 Gy in 15 fractions 2. Boost / 10 Gy in 5 fractions     CHIEF COMPLIANT: Follow-up of left breast cancer  INTERVAL HISTORY: Linda Li is a 74 y.o. with above-mentioned history of left breast cancer who underwent a lumpectomy, radiation, and is currently on anastrozole. She presents to the clinic today for her annual checkup.  She has been having lots of arthritis in her back and her left hip.  She is  seeing a pain specialist for this.  She has recently fallen and hurt her ribs and her back as well.  Because of this bone density has been postponed till April.  Her mammograms done in January were normal.  Because of this back pain she has not been able to sleep in the bed.  She has been using a recliner for sleeping.  ALLERGIES:  is allergic to shellfish-derived products.  MEDICATIONS:  Current Outpatient Medications  Medication Sig Dispense Refill  . anastrozole (ARIMIDEX) 1 MG tablet TAKE 1 TABLET BY MOUTH EVERY DAY 270 tablet 0  . aspirin EC 81 MG tablet Take 81 mg by mouth daily.    . Azelaic Acid 15 % cream Apply topically.    . Calcium Citrate 200 MG TABS Take 500 mg by mouth.    . Cholecalciferol (VITAMIN D3) 1000 UNITS CAPS Take 2,000 Units by mouth.    . Coenzyme Q10 (COQ10) 100 MG CAPS Take 100 mg by mouth.    . Cyanocobalamin ([employer] VITAMIN B-12) 5000 MCG SUBL Place under the tongue.    . escitalopram (LEXAPRO) 10 MG tablet TAKE 1 TABLET BY MOUTH EVERY DAY 90 tablet 0  . gabapentin (NEURONTIN) 300 MG capsule Take 300 mg by mouth at bedtime.    . glucose blood test strip CHECK BLOOD SUGAR DAILY.    . lisinopril (PRINIVIL,ZESTRIL) 10 MG tablet Take 20 mg by mouth.     . Magnesium 400 MG CAPS Take by mouth.    . metFORMIN (GLUCOPHAGE) 500 MG tablet Take by   mouth daily with breakfast.    . montelukast (SINGULAIR) 10 MG tablet Take 10 mg by mouth daily.    . Omega-3 Fatty Acids (FISH OIL) 1000 MG CAPS Take 1 capsule by mouth.    . OVER THE COUNTER MEDICATION Take 1 tablet by mouth at bedtime. 5-HTP Plus (Natrol)    . OVER THE COUNTER MEDICATION Take 1 tablet by mouth at bedtime. Ashwaganda root.    . SUMAtriptan (IMITREX) 100 MG tablet 1 po immediately at onset of migraine; may repeat in 2 hrs if HA persists; no more than 2 in 24 hrs    . traMADol (ULTRAM) 50 MG tablet Take 1 tablet by mouth up to twice daily as needed for pain. 30 tablet 0  . TURMERIC PO Take by mouth.    . UNABLE  TO FIND Take 2 capsules by mouth daily. CINSULIN    . vitamin C (ASCORBIC ACID) 500 MG tablet Take 500 mg by mouth.     Current Facility-Administered Medications  Medication Dose Route Frequency Provider Last Rate Last Admin  . lidocaine (PF) (XYLOCAINE) 1 % injection 0.3 mL  0.3 mL Other Once Newton, Frederic, MD        PHYSICAL EXAMINATION: ECOG PERFORMANCE STATUS: 1 - Symptomatic but completely ambulatory  Vitals:   07/10/19 0942  BP: (!) 172/69  Pulse: 92  Resp: 18  Temp: 97.8 F (36.6 C)  SpO2: 95%   Filed Weights   07/10/19 0942  Weight: 273 lb 1.6 oz (123.9 kg)    BREAST: No palpable masses or nodules in either right or left breasts. No palpable axillary supraclavicular or infraclavicular adenopathy no breast tenderness or nipple discharge. (exam performed in the presence of a chaperone)  LABORATORY DATA:  I have reviewed the data as listed CMP Latest Ref Rng & Units 06/15/2018  Glucose 70 - 99 mg/dL 132(H)  BUN 8 - 23 mg/dL 19  Creatinine 0.44 - 1.00 mg/dL 0.75  Sodium 135 - 145 mmol/L 140  Potassium 3.5 - 5.1 mmol/L 4.4  Chloride 98 - 111 mmol/L 104  CO2 22 - 32 mmol/L 24  Calcium 8.9 - 10.3 mg/dL 10.0  Total Protein 6.5 - 8.1 g/dL 7.5  Total Bilirubin 0.3 - 1.2 mg/dL 0.3  Alkaline Phos 38 - 126 U/L 97  AST 15 - 41 U/L 14(L)  ALT 0 - 44 U/L 19    Lab Results  Component Value Date   WBC 9.4 06/15/2018   HGB 14.9 06/15/2018   HCT 43.2 06/15/2018   MCV 88.0 06/15/2018   PLT 251 06/15/2018   NEUTROABS 4.9 06/15/2018    ASSESSMENT & PLAN:  Malignant neoplasm of upper-outer quadrant of left breast in female, estrogen receptor positive (HCC) 07/08/2018: Left lumpectomy: Grade 2 IDC, 1.3 cm, margins negative, negative for lymphovascular or perineural invasion, ER 100%, PR 90%, HER-2 -1+, Ki-67 3%, T1CNX stage Ia Oncotype DX recurrence score 4, risk of distant recurrence: 3%, low risk Adjuvant radiation therapy started 11/04/2018 Adjuvant antiestrogen therapy  started April 2020  Anastrozole toxicities: Severe hot flashes: Continues to be the same.  Breast cancer surveillance: 1.  Breast exam 07/10/2019: Benign 2.  Mammogram 06/12/2019: Benign breast density category B  Severe arthritis: Patient is seeing a pain physician.  Return to clinic in 1 year for follow-up    No orders of the defined types were placed in this encounter.  The patient has a good understanding of the overall plan. she agrees with it. she will call with any   problems that may develop before the next visit here.  Total time spent: 30 mins including face to face time and time spent for planning, charting and coordination of care  Nicholas Lose, MD 07/10/2019  I, Cloyde Reams Dorshimer, am acting as scribe for Dr. Nicholas Lose.  I have reviewed the above documentation for accuracy and completeness, and I agree with the above.

## 2019-07-10 ENCOUNTER — Inpatient Hospital Stay: Payer: Medicare Other | Attending: Hematology and Oncology | Admitting: Hematology and Oncology

## 2019-07-10 ENCOUNTER — Other Ambulatory Visit: Payer: Self-pay

## 2019-07-10 DIAGNOSIS — Z79811 Long term (current) use of aromatase inhibitors: Secondary | ICD-10-CM | POA: Insufficient documentation

## 2019-07-10 DIAGNOSIS — Z79899 Other long term (current) drug therapy: Secondary | ICD-10-CM | POA: Insufficient documentation

## 2019-07-10 DIAGNOSIS — Z923 Personal history of irradiation: Secondary | ICD-10-CM | POA: Insufficient documentation

## 2019-07-10 DIAGNOSIS — Z170 Estrogen receptor positive status [ER+]: Secondary | ICD-10-CM | POA: Diagnosis not present

## 2019-07-10 DIAGNOSIS — Z7984 Long term (current) use of oral hypoglycemic drugs: Secondary | ICD-10-CM | POA: Insufficient documentation

## 2019-07-10 DIAGNOSIS — C50412 Malignant neoplasm of upper-outer quadrant of left female breast: Secondary | ICD-10-CM | POA: Diagnosis not present

## 2019-07-10 NOTE — Assessment & Plan Note (Signed)
07/08/2018: Left lumpectomy: Grade 2 IDC, 1.3 cm, margins negative, negative for lymphovascular or perineural invasion, ER 100%, PR 90%, HER-2 -1+, Ki-67 3%, T1CNX stage Ia Oncotype DX recurrence score 4, risk of distant recurrence: 3%, low risk Adjuvant radiation therapy started 11/04/2018 Adjuvant antiestrogen therapy started April 2020  Anastrozole toxicities: Severe hot flashes:  Breast cancer surveillance: 1.  Breast exam 07/10/2019: Benign 2.  Mammogram 06/12/2019: Benign breast density category B  Return to clinic in 1 year for follow-up

## 2019-07-11 ENCOUNTER — Telehealth: Payer: Self-pay | Admitting: Hematology and Oncology

## 2019-07-11 NOTE — Telephone Encounter (Signed)
I talk with patient regarding schedule  

## 2019-07-18 ENCOUNTER — Other Ambulatory Visit: Payer: Self-pay

## 2019-07-18 ENCOUNTER — Telehealth: Payer: Self-pay | Admitting: *Deleted

## 2019-07-18 ENCOUNTER — Encounter: Payer: Self-pay | Admitting: Physical Medicine and Rehabilitation

## 2019-07-18 ENCOUNTER — Ambulatory Visit: Payer: Medicare Other | Admitting: Physical Medicine and Rehabilitation

## 2019-07-18 VITALS — BP 143/74 | HR 80 | Ht 68.5 in | Wt 265.0 lb

## 2019-07-18 DIAGNOSIS — M48062 Spinal stenosis, lumbar region with neurogenic claudication: Secondary | ICD-10-CM

## 2019-07-18 DIAGNOSIS — M5416 Radiculopathy, lumbar region: Secondary | ICD-10-CM

## 2019-07-18 DIAGNOSIS — M4316 Spondylolisthesis, lumbar region: Secondary | ICD-10-CM

## 2019-07-18 DIAGNOSIS — M47816 Spondylosis without myelopathy or radiculopathy, lumbar region: Secondary | ICD-10-CM | POA: Diagnosis not present

## 2019-07-18 MED ORDER — MELOXICAM 15 MG PO TABS: 15.0000 mg | ORAL_TABLET | Freq: Every day | ORAL | 0 refills | Status: DC

## 2019-07-18 NOTE — Progress Notes (Signed)
 .  Numeric Pain Rating Scale and Functional Assessment Average Pain 7 Pain Right Now 1 My pain is intermittent, stabbing and aching Pain is worse with: bending and some activites Pain improves with: standing and sitting   In the last MONTH (on 0-10 scale) has pain interfered with the following?  1. General activity like being  able to carry out your everyday physical activities such as walking, climbing stairs, carrying groceries, or moving a chair?  Rating(8)  2. Relation with others like being able to carry out your usual social activities and roles such as  activities at home, at work and in your community. Rating(4)  3. Enjoyment of life such that you have  been bothered by emotional problems such as feeling anxious, depressed or irritable?  Rating(8)

## 2019-07-18 NOTE — Telephone Encounter (Signed)
[id number], Injection(s), anesthetic agent and/or more  Notification/Prior Authorization not required if procedure performed in Office; otherwise may be required for this service.

## 2019-07-19 ENCOUNTER — Ambulatory Visit: Payer: Self-pay

## 2019-07-19 ENCOUNTER — Encounter: Payer: Self-pay | Admitting: Orthopaedic Surgery

## 2019-07-19 ENCOUNTER — Encounter: Payer: Self-pay | Admitting: Physical Medicine and Rehabilitation

## 2019-07-19 ENCOUNTER — Ambulatory Visit: Payer: Medicare Other | Admitting: Orthopaedic Surgery

## 2019-07-19 VITALS — Ht 68.5 in | Wt 265.0 lb

## 2019-07-19 DIAGNOSIS — M4316 Spondylolisthesis, lumbar region: Secondary | ICD-10-CM | POA: Insufficient documentation

## 2019-07-19 DIAGNOSIS — S52552A Other extraarticular fracture of lower end of left radius, initial encounter for closed fracture: Secondary | ICD-10-CM | POA: Diagnosis not present

## 2019-07-19 DIAGNOSIS — Z6841 Body Mass Index (BMI) 40.0 and over, adult: Secondary | ICD-10-CM

## 2019-07-19 DIAGNOSIS — M48062 Spinal stenosis, lumbar region with neurogenic claudication: Secondary | ICD-10-CM | POA: Insufficient documentation

## 2019-07-19 DIAGNOSIS — M25561 Pain in right knee: Secondary | ICD-10-CM | POA: Diagnosis not present

## 2019-07-19 DIAGNOSIS — G8929 Other chronic pain: Secondary | ICD-10-CM

## 2019-07-19 DIAGNOSIS — M1711 Unilateral primary osteoarthritis, right knee: Secondary | ICD-10-CM

## 2019-07-19 NOTE — Progress Notes (Signed)
Office Visit Note   Patient: Linda Li           Date of Birth: Sep 08, 1945           MRN: [id number] Visit Date: 07/19/2019              Requested by: Katherina Mires, MD [address] [place],  [place] [address] PCP: Katherina Mires, MD   Assessment & Plan: Visit Diagnoses:  1. Chronic pain of right knee   2. Other closed extra-articular fracture of distal end of left radius, initial encounter   3. Class 3 severe obesity due to excess calories without serious comorbidity with body mass index (BMI) of 40.0 to 44.9 in adult ([place])   4. Unilateral primary osteoarthritis, right knee     Plan: Advanced osteoarthritis right knee.  Long discussion regarding exercises and weight loss in regards to her BMI.  Also discussed different medical treatments for her knee and eventually even knee replacement.  Consider cortisone injection over time.  Approximately 7 weeks status post mildly dorsally displaced nondominant left distal radius fracture.  Seems to be doing quite well.  Does lack a little supination but has excellent and functional volar flexion and dorsiflexion with no swelling of her fingers.  We will try a course of physical therapy to increase his supination not wearing a splint peering having minimal discomfort Follow-Up Instructions: Return in about 1 month (around 08/19/2019).   Orders:  Orders Placed This Encounter  Procedures  . XR KNEE 3 VIEW RIGHT  . XR Wrist 2 Views Left  . Ambulatory referral to Physical Therapy   No orders of the defined types were placed in this encounter.     Procedures: No procedures performed   Clinical Data: No additional findings.   Subjective: Chief Complaint  Patient presents with  . Left Wrist - Follow-up    DOI 06/01/2019  Patient presents today for follow up on her left distal radius fracture. She has a removable wrist splint, but states that she has not been using it.. Patient states that she is improving  slowly. Her wrist seems to only hurt when she does certain things. She also wants to have her right knee evaluated today. She said that it has hurt for years, but slowly worsening. She complains of pain and swelling. Her leg is difficult to extend upon getting up. No grinding, popping, or giving way. She has a very difficult time climbing stairs. She states that she has a history of a right knee arthroscopy years ago. She does take pain medicine for her back pain.   HPI  Review of Systems   Objective: Vital Signs: Ht 5' 8.5" (1.74 m)   Wt 265 lb (120.2 kg)   BMI 39.71 kg/m   Physical Exam Constitutional:      Appearance: She is well-developed.  Eyes:     Pupils: Pupils are equal, round, and reactive to light.  Pulmonary:     Effort: Pulmonary effort is normal.  Skin:    General: Skin is warm and dry.  Neurological:     Mental Status: She is alert and oriented to person, place, and time.  Psychiatric:        Behavior: Behavior normal.     Ortho Exam left wrist with minimal tenderness along the distal radius and ulna.  No skin changes.  No edema.  Full fist with release.  Had symmetrical dorsiflexion of both wrists and minimal loss of flexion  on the left compared to the right.  Had full pronation and lacked maybe 20 degrees of supination.  Neurologically intact.  Right knee was not hot red warm or swollen.  Does have mild varus with weightbearing and predominant medial joint pain.  Some patellar crepitation and pain with patella compression.  No instability  Specialty Comments:  No specialty comments available.  Imaging: XR KNEE 3 VIEW RIGHT  Result Date: 07/19/2019 Films of the right knee were obtained in 3 projections standing.  There is significant narrowing of the medial joint space with subchondral sclerosis on both sides of the joint and 2 to 3 degrees of varus.  There is some degenerative change about the patellofemoral joint as well with narrowing of the lateral patella  facet.  Films are consistent with advanced osteoarthritis  XR Wrist 2 Views Left  Result Date: 07/19/2019 Films of the left wrist were obtained in several projections.  The distal radius fracture appears to have healed.  There is a dorsal tilt but not much loss of height on the AP.    PMFS History: Patient Active Problem List   Diagnosis Date Noted  . Spinal stenosis of lumbar region with neurogenic claudication 07/19/2019  . Spondylolisthesis of lumbar region 07/19/2019  . Unilateral primary osteoarthritis, right knee 07/19/2019  . Low back pain 06/27/2019  . Obesity 06/27/2019  . Closed fracture of left distal radius 06/01/2019  . Thumb pain, left 05/02/2019  . Malignant neoplasm of upper-outer quadrant of left breast in female, estrogen receptor positive ([place]) 06/13/2018   Past Medical History:  Diagnosis Date  . Arthritis   . Breast cancer ([place]) 06/2018   left  . Diabetes mellitus ([place])   . Diverticulosis   . Hyperlipidemia   . Hypertension   . Migraines   . Obstructive sleep apnea    pressure is 14  . Osteoporosis   . Personal history of radiation therapy 2020   Left breast   . Rosacea   . Sleep apnea    daily c-pap  . Spinal stenosis     Family History  Problem Relation Age of Onset  . Throat cancer Father   . Esophageal cancer Father   . Colon cancer Neg Hx   . Rectal cancer Neg Hx   . Stomach cancer Neg Hx     Past Surgical History:  Procedure Laterality Date  . BREAST LUMPECTOMY Left 07/08/2018  . BREAST LUMPECTOMY WITH RADIOACTIVE SEED LOCALIZATION Left 07/08/2018   Procedure: LEFT BREAST LUMPECTOMY WITH RADIOACTIVE SEED LOCALIZATION;  Surgeon: Fanny Skates, MD;  Location: [facility];  Service: General;  Laterality: Left;  . COLONOSCOPY    . KNEE ARTHROSCOPY     right torn meniscus  . POLYPECTOMY    . SHOULDER ARTHROSCOPY     left torn ligament  . TONSILLECTOMY     Social History   Occupational History  . Not on file  Tobacco  Use  . Smoking status: Never Smoker  . Smokeless tobacco: Never Used  Substance and Sexual Activity  . Alcohol use: No    Alcohol/week: 0.0 standard drinks  . Drug use: No  . Sexual activity: Not on file

## 2019-07-19 NOTE — Progress Notes (Signed)
Linda Li - 74 y.o. female MRN [id number]  Date of birth: [birth year]  Office Visit Note: Visit Date: 07/18/2019 PCP: Katherina Mires, MD Referred by: Katherina Mires, MD  Subjective: Chief Complaint  Patient presents with  . Lower Back - Pain   HPI: Linda Li is a 74 y.o. female who comes in today For evaluation management of chronic low back with exacerbation of left hip and leg pain.  She is typically followed by Dr. Joni Fears who has been seeing her more recently for distal left radial fracture.  He had noted some increased low back pain and did obtain x-rays of her lower spine which is reviewed with the patient today.  We have MRI from 2016.  When I saw her a few years ago we completed epidural injection with good relief.  She complains of exacerbation of chronic lower back pain across the lower back now with left-sided pain in the buttocks and at times it will go down the leg and more of an L5 distribution.  She reports the back pain seemingly increasing over the last several months but over the last few weeks she has had more radicular pain.  She reports worsening with standing and walking and having difficulty sleeping on either side.  She does endorse some pain over the greater trochanters.  Getting up from a seated position is difficult.  She actually is in the process of getting a lift chair for the bed because of orthopedic complaints and decreased mobility.  She is going to talk to Dr. Durward Fortes about a prescription for this.  She has not had any specific trauma or focal weakness.  No prior lumbar surgery.  Again his notes reviewed as well as the MRI.  MRI from 2016 does show moderate to severe multifactorial stenosis at L4-5 with grade 1 listhesis.  Dr. Durward Fortes reports on the x-ray recently of grade 2 listhesis.  I reviewed that myself and it still a grade 1 listhesis.  She has been using some gabapentin at night and tramadol.  Review of Systems    Constitutional: Negative for chills, fever, malaise/fatigue and weight loss.  HENT: Negative for hearing loss and sinus pain.   Eyes: Negative for blurred vision, double vision and photophobia.  Respiratory: Negative for cough and shortness of breath.   Cardiovascular: Negative for chest pain, palpitations and leg swelling.  Gastrointestinal: Negative for abdominal pain, nausea and vomiting.  Genitourinary: Negative for flank pain.  Musculoskeletal: Positive for back pain. Negative for myalgias.       Left radicular leg pain  Skin: Negative for itching and rash.  Neurological: Negative for tremors, focal weakness and weakness.  Endo/Heme/Allergies: Negative.   Psychiatric/Behavioral: Negative for depression.  All other systems reviewed and are negative.  Otherwise per HPI.  Assessment & Plan: Visit Diagnoses:  1. Spinal stenosis of lumbar region with neurogenic claudication   2. Lumbar radiculopathy   3. Spondylolisthesis of lumbar region   4. Spondylosis without myelopathy or radiculopathy, lumbar region     Plan: Findings:  Chronic history of low back pain with intermittent radicular pain data facet arthropathy and listhesis and stenosis at L4-5.  No red flag complaints to warrant new updated MRI imaging.  I would be sure to get an MRI image at this point depending on relief with treatment.  Recent x-rays show again listhesis of L4 on L5 with facet arthropathy.  She has pain down the left leg is seem to be radicular.  She has failed conservative care with medication management and activity modification.  She is somewhat limited mobility.  Her case is complicated by morbid obesity.  At this point we will schedule L5-S1 interlaminar epidural steroid injection with fluoroscopic guidance on the left.  We went over the risk and benefits of this.  Depending on relief would look at transforaminal approach versus facet joint block versus MRI.  We are also going to have her start meloxicam just  for a week to see if that calms things down.  That should be okay with her current medical conditions for just 1 week.  We discussed this at length.  We are also going to have her take Tylenol 3 times a day scheduled instead of as needed.    Meds & Orders:  Meds ordered this encounter  Medications  . meloxicam (MOBIC) 15 MG tablet    Sig: Take 1 tablet (15 mg total) by mouth daily. Take with food    Dispense:  30 tablet    Refill:  0   No orders of the defined types were placed in this encounter.   Follow-up: Return for Left L5-S1 interlaminar epidural steroid injection.   Procedures: No procedures performed  No notes on file   Clinical History: Lumbar spine MRI  06/25/2014  L4-L5: 6 mm anterolisthesis. Central bulging annular fibers. Severe facet arthropathy with BILATERAL joint effusions and ligamentum flavum hypertrophy. Moderate to severe central canal stenosis. BILATERAL subarticular zone narrowing without foraminal narrowing. Either L5 nerve root could be affected.  L5-S1: Disc space narrowing with central disc osteophyte complex extending primarily into the LEFT neural foramen. Trace retrolisthesis. Posterior element hypertrophy. LEFT subarticular zone and foraminal zone narrowing without definite L5 or S1 nerve root impingement.  IMPRESSION: 6 mm of degenerative spondylolisthesis at L4-5. Moderate to severe central canal stenosis with BILATERAL subarticular zone narrowing which could affect either L5 nerve root.  Asymmetric LEFT subarticular zone and foraminal zone narrowing at L5-S1 due to disc osteophyte complex, bony overgrowth, trace retrolisthesis, and facet hypertrophy. Clear-cut LEFT-sided nerve root impingement is not established.   She reports that she has never smoked. She has never used smokeless tobacco. No results for input(s): HGBA1C, LABURIC in the last 8760 hours.  Objective:  VS:  HT:5' 8.5" (174 cm)   WT:265 lb (120.2 kg)  BMI:39.7    BP:(!)  143/74  HR:80bpm  TEMP: ( )  RESP:  Physical Exam Vitals and nursing note reviewed.  Constitutional:      General: She is not in acute distress.    Appearance: Normal appearance. She is well-developed. She is obese. She is not ill-appearing.  HENT:     Head: Normocephalic and atraumatic.  Eyes:     Conjunctiva/sclera: Conjunctivae normal.     Pupils: Pupils are equal, round, and reactive to light.  Cardiovascular:     Rate and Rhythm: Normal rate.     Pulses: Normal pulses.  Pulmonary:     Effort: Pulmonary effort is normal.  Musculoskeletal:     Right lower leg: No edema.     Left lower leg: No edema.     Comments: Patient has a lot of difficulty going from sit to stand.  She has pain with facet loading and extension of the lumbar spine.  She does have pain over both greater trochanters but is not exquisite.  No pain with hip rotation.  Good distal strength without any clonus.  Skin:    General: Skin is warm and dry.  Findings: No erythema or rash.  Neurological:     General: No focal deficit present.     Mental Status: She is alert and oriented to person, place, and time.     Sensory: No sensory deficit.     Motor: No abnormal muscle tone.     Coordination: Coordination normal.     Gait: Gait abnormal.  Psychiatric:        Mood and Affect: Mood normal.        Behavior: Behavior normal.     Ortho Exam Imaging: No results found.  Past Medical/Family/Surgical/Social History: Medications & Allergies reviewed per EMR, new medications updated. Patient Active Problem List   Diagnosis Date Noted  . Spinal stenosis of lumbar region with neurogenic claudication 07/19/2019  . Spondylolisthesis of lumbar region 07/19/2019  . Low back pain 06/27/2019  . Obesity 06/27/2019  . Closed fracture of left distal radius 06/01/2019  . Thumb pain, left 05/02/2019  . Malignant neoplasm of upper-outer quadrant of left breast in female, estrogen receptor positive ([place]) 06/13/2018    Past Medical History:  Diagnosis Date  . Arthritis   . Breast cancer ([place]) 06/2018   left  . Diabetes mellitus ([place])   . Diverticulosis   . Hyperlipidemia   . Hypertension   . Migraines   . Obstructive sleep apnea    pressure is 14  . Osteoporosis   . Personal history of radiation therapy 2020   Left breast   . Rosacea   . Sleep apnea    daily c-pap  . Spinal stenosis    Family History  Problem Relation Age of Onset  . Throat cancer Father   . Esophageal cancer Father   . Colon cancer Neg Hx   . Rectal cancer Neg Hx   . Stomach cancer Neg Hx    Past Surgical History:  Procedure Laterality Date  . BREAST LUMPECTOMY Left 07/08/2018  . BREAST LUMPECTOMY WITH RADIOACTIVE SEED LOCALIZATION Left 07/08/2018   Procedure: LEFT BREAST LUMPECTOMY WITH RADIOACTIVE SEED LOCALIZATION;  Surgeon: Fanny Skates, MD;  Location: [facility];  Service: General;  Laterality: Left;  . COLONOSCOPY    . KNEE ARTHROSCOPY     right torn meniscus  . POLYPECTOMY    . SHOULDER ARTHROSCOPY     left torn ligament  . TONSILLECTOMY     Social History   Occupational History  . Not on file  Tobacco Use  . Smoking status: Never Smoker  . Smokeless tobacco: Never Used  Substance and Sexual Activity  . Alcohol use: No    Alcohol/week: 0.0 standard drinks  . Drug use: No  . Sexual activity: Not on file

## 2019-08-01 ENCOUNTER — Telehealth: Payer: Self-pay | Admitting: Physical Medicine and Rehabilitation

## 2019-08-03 ENCOUNTER — Ambulatory Visit: Payer: Self-pay

## 2019-08-03 ENCOUNTER — Encounter: Payer: Self-pay | Admitting: Physical Medicine and Rehabilitation

## 2019-08-03 ENCOUNTER — Ambulatory Visit: Payer: Medicare Other | Admitting: Physical Therapy

## 2019-08-03 ENCOUNTER — Other Ambulatory Visit: Payer: Self-pay

## 2019-08-03 ENCOUNTER — Other Ambulatory Visit: Payer: Self-pay | Admitting: Physical Medicine and Rehabilitation

## 2019-08-03 ENCOUNTER — Ambulatory Visit: Payer: Medicare Other | Admitting: Physical Medicine and Rehabilitation

## 2019-08-03 VITALS — BP 150/87 | HR 73

## 2019-08-03 DIAGNOSIS — M5416 Radiculopathy, lumbar region: Secondary | ICD-10-CM

## 2019-08-03 DIAGNOSIS — M48062 Spinal stenosis, lumbar region with neurogenic claudication: Secondary | ICD-10-CM | POA: Diagnosis not present

## 2019-08-03 DIAGNOSIS — F411 Generalized anxiety disorder: Secondary | ICD-10-CM

## 2019-08-03 MED ORDER — METHYLPREDNISOLONE ACETATE 80 MG/ML IJ SUSP
40.0000 mg | Freq: Once | INTRAMUSCULAR | Status: AC
  Administered 2019-08-03: 15:00:00 40 mg

## 2019-08-03 MED ORDER — DIAZEPAM 5 MG PO TABS: ORAL_TABLET | ORAL | 0 refills | Status: DC

## 2019-08-03 NOTE — Telephone Encounter (Signed)
Called patient and lvm advising that Valium has been sent to pharmacy.

## 2019-08-03 NOTE — Progress Notes (Signed)
 .  Numeric Pain Rating Scale and Functional Assessment Average Pain 4   In the last MONTH (on 0-10 scale) has pain interfered with the following?  1. General activity like being  able to carry out your everyday physical activities such as walking, climbing stairs, carrying groceries, or moving a chair?  Rating(8)   +Driver, +BT, -Dye Allergies.  

## 2019-08-03 NOTE — Telephone Encounter (Signed)
Did today

## 2019-08-03 NOTE — Progress Notes (Signed)
Pre-procedure diazepam ordered for pre-operative anxiety.  

## 2019-08-04 NOTE — Progress Notes (Signed)
Linda Li - 74 y.o. female MRN [id number]  Date of birth: [birth year]  Office Visit Note: Visit Date: 08/03/2019 PCP: Katherina Mires, MD Referred by: Katherina Mires, MD  Subjective: Chief Complaint  Patient presents with  . Lower Back - Pain   HPI:  Linda Li is a 74 y.o. female who comes in today For planned left L5-S1 interlaminar epidural steroid injection.  The patient has failed conservative care including home exercise, medications, time and activity modification.  This injection will be diagnostic and hopefully therapeutic.  Please see requesting physician notes for further details and justification.   ROS Otherwise per HPI.  Assessment & Plan: Visit Diagnoses:  1. Spinal stenosis of lumbar region with neurogenic claudication   2. Lumbar radiculopathy     Plan: No additional findings.   Meds & Orders:  Meds ordered this encounter  Medications  . methylPREDNISolone acetate (DEPO-MEDROL) injection 40 mg    Orders Placed This Encounter  Procedures  . XR C-ARM NO REPORT  . Epidural Steroid injection    Follow-up: Return if symptoms worsen or fail to improve.   Procedures: No procedures performed  Lumbar Epidural Steroid Injection - Interlaminar Approach with Fluoroscopic Guidance  Patient: Linda Li      Date of Birth: [birth year] MRN: [id number] PCP: Katherina Mires, MD      Visit Date: 08/03/2019   Universal Protocol:     Consent Given By: the patient  Position: PRONE  Additional Comments: Vital signs were monitored before and after the procedure. Patient was prepped and draped in the usual sterile fashion. The correct patient, procedure, and site was verified.   Injection Procedure Details:  Procedure Site One Meds Administered:  Meds ordered this encounter  Medications  . methylPREDNISolone acetate (DEPO-MEDROL) injection 40 mg     Laterality: Left  Location/Site:  L5-S1  Needle size: 20 G  Needle  type: Tuohy  Needle Placement: Paramedian epidural  Findings:   -Comments: Excellent flow of contrast into the epidural space.  Procedure Details: Using a paramedian approach from the side mentioned above, the region overlying the inferior lamina was localized under fluoroscopic visualization and the soft tissues overlying this structure were infiltrated with 4 ml. of 1% Lidocaine without Epinephrine. The Tuohy needle was inserted into the epidural space using a paramedian approach.   The epidural space was localized using loss of resistance along with lateral and bi-planar fluoroscopic views.  After negative aspirate for air, blood, and CSF, a 2 ml. volume of Isovue-250 was injected into the epidural space and the flow of contrast was observed. Radiographs were obtained for documentation purposes.    The injectate was administered into the level noted above.   Additional Comments:  The patient tolerated the procedure well Dressing: 2 x 2 sterile gauze and Band-Aid    Post-procedure details: Patient was observed during the procedure. Post-procedure instructions were reviewed.  Patient left the clinic in stable condition.    Clinical History: Lumbar spine MRI  06/25/2014  L4-L5: 6 mm anterolisthesis. Central bulging annular fibers. Severe facet arthropathy with BILATERAL joint effusions and ligamentum flavum hypertrophy. Moderate to severe central canal stenosis. BILATERAL subarticular zone narrowing without foraminal narrowing. Either L5 nerve root could be affected.  L5-S1: Disc space narrowing with central disc osteophyte complex extending primarily into the LEFT neural foramen. Trace retrolisthesis. Posterior element hypertrophy. LEFT subarticular zone and foraminal zone narrowing without definite L5 or S1 nerve root impingement.  IMPRESSION: 6 mm of  degenerative spondylolisthesis at L4-5. Moderate to severe central canal stenosis with BILATERAL subarticular zone  narrowing which could affect either L5 nerve root.  Asymmetric LEFT subarticular zone and foraminal zone narrowing at L5-S1 due to disc osteophyte complex, bony overgrowth, trace retrolisthesis, and facet hypertrophy. Clear-cut LEFT-sided nerve root impingement is not established.     Objective:  VS:  HT:    WT:   BMI:     BP:(!) 150/87  HR:73bpm  TEMP: ( )  RESP:  Physical Exam  Ortho Exam Imaging: XR C-ARM NO REPORT  Result Date: 08/03/2019 Please see Notes tab for imaging impression.

## 2019-08-04 NOTE — Procedures (Signed)
Lumbar Epidural Steroid Injection - Interlaminar Approach with Fluoroscopic Guidance  Patient: Linda Li      Date of Birth: 10-Sep-1945 MRN: [id number] PCP: Katherina Mires, MD      Visit Date: 08/03/2019   Universal Protocol:     Consent Given By: the patient  Position: PRONE  Additional Comments: Vital signs were monitored before and after the procedure. Patient was prepped and draped in the usual sterile fashion. The correct patient, procedure, and site was verified.   Injection Procedure Details:  Procedure Site One Meds Administered:  Meds ordered this encounter  Medications  . methylPREDNISolone acetate (DEPO-MEDROL) injection 40 mg     Laterality: Left  Location/Site:  L5-S1  Needle size: 20 G  Needle type: Tuohy  Needle Placement: Paramedian epidural  Findings:   -Comments: Excellent flow of contrast into the epidural space.  Procedure Details: Using a paramedian approach from the side mentioned above, the region overlying the inferior lamina was localized under fluoroscopic visualization and the soft tissues overlying this structure were infiltrated with 4 ml. of 1% Lidocaine without Epinephrine. The Tuohy needle was inserted into the epidural space using a paramedian approach.   The epidural space was localized using loss of resistance along with lateral and bi-planar fluoroscopic views.  After negative aspirate for air, blood, and CSF, a 2 ml. volume of Isovue-250 was injected into the epidural space and the flow of contrast was observed. Radiographs were obtained for documentation purposes.    The injectate was administered into the level noted above.   Additional Comments:  The patient tolerated the procedure well Dressing: 2 x 2 sterile gauze and Band-Aid    Post-procedure details: Patient was observed during the procedure. Post-procedure instructions were reviewed.  Patient left the clinic in stable condition.

## 2019-08-10 ENCOUNTER — Other Ambulatory Visit: Payer: Self-pay | Admitting: Physical Medicine and Rehabilitation

## 2019-08-10 NOTE — Telephone Encounter (Signed)
Please advise 

## 2019-08-11 ENCOUNTER — Ambulatory Visit: Payer: Medicare Other | Attending: Orthopaedic Surgery | Admitting: Physical Therapy

## 2019-08-11 ENCOUNTER — Encounter: Payer: Self-pay | Admitting: Physical Therapy

## 2019-08-11 ENCOUNTER — Other Ambulatory Visit: Payer: Self-pay

## 2019-08-11 DIAGNOSIS — M25532 Pain in left wrist: Secondary | ICD-10-CM | POA: Insufficient documentation

## 2019-08-11 DIAGNOSIS — M6281 Muscle weakness (generalized): Secondary | ICD-10-CM | POA: Diagnosis present

## 2019-08-11 DIAGNOSIS — M25632 Stiffness of left wrist, not elsewhere classified: Secondary | ICD-10-CM | POA: Insufficient documentation

## 2019-08-11 NOTE — Therapy (Signed)
[place], [place], [address] Phone: [phone]   Fax:  [phone]  Physical Therapy Evaluation  Patient Details  Name: Linda Li MRN: [id number] Date of Birth: Jun 30, 1945 Referring Provider (PT): Garald Balding, MD   Encounter Date: 08/11/2019  PT End of Session - 08/11/19 0955    Visit Number  1    Number of Visits  8    Date for PT Re-Evaluation  10/06/19    Authorization Type  [employer] MCR    PT Start Time  1000    PT Stop Time  1045    PT Time Calculation (min)  45 min    Activity Tolerance  Patient tolerated treatment well    Behavior During Therapy  [facility] for tasks assessed/performed       Past Medical History:  Diagnosis Date  . Arthritis   . Breast cancer ([place]) 06/2018   left  . Diabetes mellitus ([place])   . Diverticulosis   . Hyperlipidemia   . Hypertension   . Migraines   . Obstructive sleep apnea    pressure is 14  . Osteoporosis   . Personal history of radiation therapy 2020   Left breast   . Rosacea   . Sleep apnea    daily c-pap  . Spinal stenosis     Past Surgical History:  Procedure Laterality Date  . BREAST LUMPECTOMY Left 07/08/2018  . BREAST LUMPECTOMY WITH RADIOACTIVE SEED LOCALIZATION Left 07/08/2018   Procedure: LEFT BREAST LUMPECTOMY WITH RADIOACTIVE SEED LOCALIZATION;  Surgeon: Fanny Skates, MD;  Location: [facility];  Service: General;  Laterality: Left;  . COLONOSCOPY    . KNEE ARTHROSCOPY     right torn meniscus  . POLYPECTOMY    . SHOULDER ARTHROSCOPY     left torn ligament  . TONSILLECTOMY      There were no vitals filed for this visit.   Subjective Assessment - 08/11/19 0959    Subjective  Patient reports in November 2019 she fell and broke her left ulna, then in September of 2020 she fell again and broke her left radius. Patient plays instruments and feels like she is limited with her motion so she has difficulty with  playing. She will occasionally have pain when she moves or does something the wrong way, and it is very situational.    Limitations  House hold activities;Lifting;Writing    Diagnostic tests  X-ray    Patient Stated Goals  Improve mobility and strength of wrist so she can play instruments    Currently in Pain?  Yes    Pain Score  0-No pain    Pain Location  Wrist    Pain Orientation  Left    Pain Descriptors / Indicators  Aching;Sharp    Pain Type  Chronic pain    Pain Onset  More than a month ago    Pain Frequency  Intermittent    Aggravating Factors   Moving the wrist the wrong way, lifting, playing instruments    Pain Relieving Factors  Rest and stop aggravating activity    Effect of Pain on Daily Activities  Patient has been avoiding using the left wrist with heavy activities         [facility] PT Assessment - 08/11/19 0001      Assessment   Medical Diagnosis  S/p closed extra-articular fracture of distal end of left radius    Referring Provider (PT)  Garald Balding, MD  Onset Date/Surgical Date  --   01/2019   Hand Dominance  Right    Next MD Visit  Not scheduled    Prior Therapy  No      Precautions   Precautions  None      Restrictions   Weight Bearing Restrictions  No      Balance Screen   Has the patient fallen in the past 6 months  Yes    How many times?  1 - twisted left ankle and fell on her back, 2 previous where she fractured her wrist    Has the patient had a decrease in activity level because of a fear of falling?   No    Is the patient reluctant to leave their home because of a fear of falling?   No      Home Film/video editor residence    Living Arrangements  Spouse/significant other    Home Access  Level entry    Home Layout  Two level   has a chair lift     Prior Function   Level of Independence  Independent    Leisure  Playing instruments      Cognition   Overall Cognitive Status  Within Functional Limits for tasks  assessed      Observation/Other Assessments   Observations  Patient appears in no apparent distress    Focus on Therapeutic Outcomes (FOTO)   41% limitation      Sensation   Light Touch  Appears Intact      ROM / Strength   AROM / PROM / Strength  AROM;Strength      AROM   AROM Assessment Site  Forearm;Wrist    Right/Left Elbow  Left;Right    Right/Left Forearm  Right;Left    Right Forearm Pronation  80 Degrees    Right Forearm Supination  70 Degrees    Left Forearm Pronation  80 Degrees    Left Forearm Supination  40 Degrees    Right/Left Wrist  Left    Right Wrist Extension  60 Degrees    Right Wrist Flexion  70 Degrees    Right Wrist Radial Deviation  25 Degrees    Right Wrist Ulnar Deviation  35 Degrees    Left Wrist Extension  70 Degrees    Left Wrist Flexion  50 Degrees   pain ulnar side of wrist   Left Wrist Radial Deviation  25 Degrees    Left Wrist Ulnar Deviation  25 Degrees      Strength   Strength Assessment Site  Wrist;Hand;Forearm    Right/Left Forearm  Right;Left    Right Forearm Pronation  5/5    Right Forearm Supination  5/5    Left Forearm Pronation  4/5    Left Forearm Supination  4/5    Right/Left Wrist  Right;Left    Right Wrist Flexion  5/5    Right Wrist Extension  5/5    Right Wrist Radial Deviation  5/5    Right Wrist Ulnar Deviation  5/5    Left Wrist Flexion  4/5    Left Wrist Extension  4/5    Left Wrist Radial Deviation  4/5    Left Wrist Ulnar Deviation  4/5    Right/Left hand  Right;Left    Right Hand Grip (lbs)  55, 55, 54    Left Hand Grip (lbs)  35, 32, 27      Palpation   Palpation comment  Mild TTP at distal radius and ulna      Special Tests   Other special tests  Not performed      Transfers   Transfers  Independent with all Transfers                Objective measurements completed on examination: See above findings.      [facility] Adult PT Treatment/Exercise - 08/11/19 0001      Exercises   Exercises   Wrist;Hand;Elbow      Elbow Exercises   Forearm Supination  10 reps    Forearm Supination Limitations  hammer    Forearm Pronation  10 reps    Forearm Pronation Limitations  hammer      Hand Exercises   Other Hand Exercises  Putty opposition x10 each finger    Other Hand Exercises  Putty grip x10      Wrist Exercises   Wrist Flexion  10 reps    Bar Weights/Barbell (Wrist Flexion)  1 lb    Wrist Extension  10 reps    Bar Weights/Barbell (Wrist Extension)  1 lb    Wrist Radial Deviation  10 reps    Wrist Radial Deviation Limitations  hammer    Wrist Ulnar Deviation  10 reps    Wrist Ulnar Deviation Limitations  hammer             PT Education - 08/11/19 0955    Education Details  Exam findings, POC, HEP    Person(s) Educated  Patient    Methods  Explanation;Demonstration;Tactile cues;Verbal cues;Handout    Comprehension  Verbalized understanding;Returned demonstration;Verbal cues required;Tactile cues required;Need further instruction       PT Short Term Goals - 08/11/19 1011      PT SHORT TERM GOAL #1   Title  Patient will be I with initial HEP to progress with PT    Time  4    Period  Weeks    Status  New    Target Date  09/08/19      PT SHORT TERM GOAL #2   Title  Patient will be able to perform light to moderate household activities without limitation or pain    Time  4    Period  Weeks    Status  New    Target Date  09/08/19        PT Long Term Goals - 08/11/19 1012      PT LONG TERM GOAL #1   Title  Patient will be I with final HEP to maintain progress from PT    Time  8    Period  Weeks    Status  New    Target Date  10/06/19      PT LONG TERM GOAL #2   Title  Patient will report improved functional level to </= 32% limitation on FOTO    Time  8    Period  Weeks    Status  New    Target Date  10/06/19      PT LONG TERM GOAL #3   Title  Patient will exhibit improve wrist strength to grossly >/= 4+/5 MMT and grip strength >/= 45 lbs to  improve ability to lift heavy pots    Time  8    Period  Weeks    Status  New    Target Date  10/06/19      PT LONG TERM GOAL #4   Title  Patient will be able to perform  usual hobbies including playing instruments with little to no difficulty    Time  8    Period  Weeks    Status  New    Target Date  10/06/19             Plan - 08/11/19 0955    Clinical Impression Statement  Patient presents to PT with following left wrist fracture in 01/2019. She currently exhibits limited wrist motion into flexion, supination, and ulnar deviation, with strength impairments of the wrist and hand that limits her ability to lift objects and play her instruments with her left hand. She would benefit from continued skilled PT to progress her wrist motion and strength in order to improve her lifting ability and allow her to play her instruments without pain or limitation.    Personal Factors and Comorbidities  Age;Fitness;Time since onset of injury/illness/exacerbation    Examination-Activity Limitations  Lift;Carry    Examination-Participation Restrictions  Meal Prep;Shop   Playing instruments   Stability/Clinical Decision Making  Stable/Uncomplicated    Clinical Decision Making  Low    Rehab Potential  Good    PT Frequency  1x / week    PT Duration  8 weeks    PT Treatment/Interventions  ADLs/Self Care Home Management;Cryotherapy;Electrical Stimulation;Moist Heat;Therapeutic exercise;Therapeutic activities;Patient/family education;Manual techniques;Dry needling;Passive range of motion;Taping;Joint Manipulations    PT Next Visit Plan  Assess HEP and progress PRN, manual/mobs for wrist mobility, progress strengthening of wrist and hand    PT Home Exercise Plan  [id number]: wrist flexion and extension with 1-2 lbs, pronation/supination with hammer, radial and ulnar deviation with hammer, putty hand grip, putty finger opposition    Consulted and Agree with Plan of Care  Patient       Patient will  benefit from skilled therapeutic intervention in order to improve the following deficits and impairments:  Decreased range of motion, Decreased strength, Decreased activity tolerance, Pain  Visit Diagnosis: Pain in left wrist  Stiffness of left wrist, not elsewhere classified  Muscle weakness (generalized)     Problem List Patient Active Problem List   Diagnosis Date Noted  . Spinal stenosis of lumbar region with neurogenic claudication 07/19/2019  . Spondylolisthesis of lumbar region 07/19/2019  . Unilateral primary osteoarthritis, right knee 07/19/2019  . Low back pain 06/27/2019  . Obesity 06/27/2019  . Closed fracture of left distal radius 06/01/2019  . Thumb pain, left 05/02/2019  . Malignant neoplasm of upper-outer quadrant of left breast in female, estrogen receptor positive ([place]) 06/13/2018    Hilda Blades, PT, DPT, LAT, ATC 08/11/19  11:12 AM Phone: [phone] Fax: [facility] [address] [place], [place], [address] Phone: [phone]   Fax:  [phone]  Name: Linda Li MRN: [id number] Date of Birth: 1946/02/02

## 2019-08-11 NOTE — Patient Instructions (Signed)
Access Code: [email or web address] URL: [email or web address] Date: 08/11/2019 Prepared by: Hilda Blades  Exercises Seated Wrist Extension with Dumbbell - 1 x daily - 7 x weekly - 10 reps - 3 sets Seated Wrist Flexion with Dumbbell - 1 x daily - 7 x weekly - 10 reps - 3 sets Forearm Pronation and Supination with Hammer - 1 x daily - 7 x weekly - 3 sets - 10 reps Standing Wrist Radial Deviation with Hammer - 1 x daily - 7 x weekly - 10 reps - 3 sets Standing Wrist Ulnar Deviation with Hammer - 1 x daily - 7 x weekly - 10 reps - 3 sets Putty Squeezes - 1 x daily - 7 x weekly - 10 reps - 3 sets Thumb Opposition with Putty - 1 x daily - 7 x weekly - 10 reps - 3 sets

## 2019-08-21 ENCOUNTER — Other Ambulatory Visit: Payer: Self-pay

## 2019-08-21 ENCOUNTER — Ambulatory Visit
Admission: RE | Admit: 2019-08-21 | Discharge: 2019-08-21 | Disposition: A | Discharge status: Home / Self Care | Payer: Medicare Other | Source: Ambulatory Visit | Attending: Adult Health | Admitting: Adult Health

## 2019-08-21 DIAGNOSIS — Z170 Estrogen receptor positive status [ER+]: Secondary | ICD-10-CM

## 2019-08-21 DIAGNOSIS — C50412 Malignant neoplasm of upper-outer quadrant of left female breast: Secondary | ICD-10-CM

## 2019-08-21 DIAGNOSIS — E2839 Other primary ovarian failure: Secondary | ICD-10-CM

## 2019-08-28 ENCOUNTER — Other Ambulatory Visit: Payer: Self-pay

## 2019-08-28 ENCOUNTER — Ambulatory Visit: Payer: Medicare Other | Attending: Orthopaedic Surgery | Admitting: Physical Therapy

## 2019-08-28 ENCOUNTER — Encounter: Payer: Self-pay | Admitting: Physical Therapy

## 2019-08-28 DIAGNOSIS — M25532 Pain in left wrist: Secondary | ICD-10-CM | POA: Diagnosis present

## 2019-08-28 DIAGNOSIS — M6281 Muscle weakness (generalized): Secondary | ICD-10-CM | POA: Diagnosis present

## 2019-08-28 DIAGNOSIS — M25511 Pain in right shoulder: Secondary | ICD-10-CM | POA: Diagnosis present

## 2019-08-28 DIAGNOSIS — M25632 Stiffness of left wrist, not elsewhere classified: Secondary | ICD-10-CM

## 2019-08-28 DIAGNOSIS — M25561 Pain in right knee: Secondary | ICD-10-CM | POA: Diagnosis present

## 2019-08-28 DIAGNOSIS — M25521 Pain in right elbow: Secondary | ICD-10-CM | POA: Diagnosis present

## 2019-08-28 DIAGNOSIS — G8929 Other chronic pain: Secondary | ICD-10-CM | POA: Diagnosis present

## 2019-08-28 DIAGNOSIS — R2689 Other abnormalities of gait and mobility: Secondary | ICD-10-CM | POA: Diagnosis present

## 2019-08-28 DIAGNOSIS — M545 Low back pain: Secondary | ICD-10-CM | POA: Insufficient documentation

## 2019-08-28 NOTE — Therapy (Signed)
[place], [place], [address] Phone: [phone]   Fax:  [phone]  Physical Therapy Treatment  Patient Details  Name: Linda Li MRN: [id number] Date of Birth: 08-01-1945 Referring Provider (PT): Garald Balding, MD   Encounter Date: 08/28/2019  PT End of Session - 08/28/19 1233    Visit Number  2    Number of Visits  8    Date for PT Re-Evaluation  10/06/19    Authorization Type  [employer] MCR    PT Start Time  1230    PT Stop Time  1310    PT Time Calculation (min)  40 min       Past Medical History:  Diagnosis Date  . Arthritis   . Breast cancer ([place]) 06/2018   left  . Diabetes mellitus ([place])   . Diverticulosis   . Hyperlipidemia   . Hypertension   . Migraines   . Obstructive sleep apnea    pressure is 14  . Osteoporosis   . Personal history of radiation therapy 2020   Left breast   . Rosacea   . Sleep apnea    daily c-pap  . Spinal stenosis     Past Surgical History:  Procedure Laterality Date  . BREAST LUMPECTOMY Left 07/08/2018  . BREAST LUMPECTOMY WITH RADIOACTIVE SEED LOCALIZATION Left 07/08/2018   Procedure: LEFT BREAST LUMPECTOMY WITH RADIOACTIVE SEED LOCALIZATION;  Surgeon: Fanny Skates, MD;  Location: [facility];  Service: General;  Laterality: Left;  . COLONOSCOPY    . KNEE ARTHROSCOPY     right torn meniscus  . POLYPECTOMY    . SHOULDER ARTHROSCOPY     left torn ligament  . TONSILLECTOMY      There were no vitals filed for this visit.  Subjective Assessment - 08/28/19 1232    Currently in Pain?  No/denies                       [facility] Adult PT Treatment/Exercise - 08/28/19 0001      Elbow Exercises   Forearm Supination  20 reps    Forearm Supination Limitations  hammer    Forearm Pronation  20 reps    Forearm Pronation Limitations  hammer    Other elbow exercises  Velcro pad handle turns pro/sup, wrist flex/ext        Hand Exercises   Other Hand Exercises  Putty grip x10      Wrist Exercises   Wrist Flexion  20 reps    Bar Weights/Barbell (Wrist Flexion)  2 lbs    Wrist Extension  20 reps    Bar Weights/Barbell (Wrist Extension)  2 lbs    Wrist Radial Deviation  10 reps    Wrist Radial Deviation Limitations  hammer    Wrist Ulnar Deviation  10 reps    Wrist Ulnar Deviation Limitations  hammer     Other wrist exercises  wrist flexion, extension and supination stretching 3 x 30 sec each     Other wrist exercises  Digi Grip       Manual Therapy   Manual Therapy  Soft tissue mobilization    Soft tissue mobilization  posteriolateral forearm, lateral elbow-left                PT Short Term Goals - 08/11/19 1011      PT SHORT TERM GOAL #1   Title  Patient will be I with initial HEP  to progress with PT    Time  4    Period  Weeks    Status  New    Target Date  09/08/19      PT SHORT TERM GOAL #2   Title  Patient will be able to perform light to moderate household activities without limitation or pain    Time  4    Period  Weeks    Status  New    Target Date  09/08/19        PT Long Term Goals - 08/11/19 1012      PT LONG TERM GOAL #1   Title  Patient will be I with final HEP to maintain progress from PT    Time  8    Period  Weeks    Status  New    Target Date  10/06/19      PT LONG TERM GOAL #2   Title  Patient will report improved functional level to </= 32% limitation on FOTO    Time  8    Period  Weeks    Status  New    Target Date  10/06/19      PT LONG TERM GOAL #3   Title  Patient will exhibit improve wrist strength to grossly >/= 4+/5 MMT and grip strength >/= 45 lbs to improve ability to lift heavy pots    Time  8    Period  Weeks    Status  New    Target Date  10/06/19      PT LONG TERM GOAL #4   Title  Patient will be able to perform usual hobbies including playing instruments with little to no difficulty    Time  8    Period  Weeks    Status  New     Target Date  10/06/19            Plan - 08/28/19 1316    Clinical Impression Statement  Pt arrives without pain. She is consistent with HEP. She is still limited with full wrist flexion and supination motions. Reviewed HEP and began velcro pad strengthening. Noted limited endurance. STW to posteriolateral forearm and lateral elbow. Pt reported decreased tension afterward. Advised to elevate arm above heart for edema management as able.    PT Next Visit Plan  Assess HEP and progress PRN, manual/mobs for wrist mobility, progress strengthening of wrist and hand    PT Home Exercise Plan  [id number]: wrist flexion and extension with 1-2 lbs, pronation/supination with hammer, radial and ulnar deviation with hammer, putty hand grip, putty finger opposition       Patient will benefit from skilled therapeutic intervention in order to improve the following deficits and impairments:  Decreased range of motion, Decreased strength, Decreased activity tolerance, Pain  Visit Diagnosis: Pain in left wrist  Stiffness of left wrist, not elsewhere classified  Muscle weakness (generalized)     Problem List Patient Active Problem List   Diagnosis Date Noted  . Spinal stenosis of lumbar region with neurogenic claudication 07/19/2019  . Spondylolisthesis of lumbar region 07/19/2019  . Unilateral primary osteoarthritis, right knee 07/19/2019  . Low back pain 06/27/2019  . Obesity 06/27/2019  . Closed fracture of left distal radius 06/01/2019  . Thumb pain, left 05/02/2019  . Malignant neoplasm of upper-outer quadrant of left breast in female, estrogen receptor positive ([place]) 06/13/2018    Dorene Ar, PTA 08/28/2019, [facility] [phone]  [place], [place], [address] Phone: [phone]   Fax:  [phone]  Name: Linda Li MRN: [id number] Date of Birth: December 13, 1945

## 2019-09-02 ENCOUNTER — Other Ambulatory Visit: Payer: Self-pay | Admitting: Physical Medicine and Rehabilitation

## 2019-09-04 ENCOUNTER — Encounter: Payer: Self-pay | Admitting: Physical Therapy

## 2019-09-04 ENCOUNTER — Ambulatory Visit: Payer: Medicare Other | Admitting: Physical Therapy

## 2019-09-04 ENCOUNTER — Other Ambulatory Visit: Payer: Self-pay

## 2019-09-04 DIAGNOSIS — G8929 Other chronic pain: Secondary | ICD-10-CM

## 2019-09-04 DIAGNOSIS — M25632 Stiffness of left wrist, not elsewhere classified: Secondary | ICD-10-CM

## 2019-09-04 DIAGNOSIS — M25532 Pain in left wrist: Secondary | ICD-10-CM | POA: Diagnosis not present

## 2019-09-04 DIAGNOSIS — M25521 Pain in right elbow: Secondary | ICD-10-CM

## 2019-09-04 DIAGNOSIS — M6281 Muscle weakness (generalized): Secondary | ICD-10-CM

## 2019-09-04 DIAGNOSIS — R2689 Other abnormalities of gait and mobility: Secondary | ICD-10-CM

## 2019-09-04 NOTE — Therapy (Signed)
[place], [place], [address] Phone: [phone]   Fax:  [phone]  Physical Therapy Re-eval and Treatment  Progress Note Reporting Period 08/11/2019 to 09/04/2019  See note below for Objective Data and Assessment of Progress/Goals.    Patient Details  Name: Petria Seamon MRN: [id number] Date of Birth: 1946/02/13 Referring Provider (PT): Garald Balding, MD   Encounter Date: 09/04/2019  PT End of Session - 09/04/19 1101    Visit Number  3    Number of Visits  8    Date for PT Re-Evaluation  10/06/19    Authorization Type  [employer] MCR    PT Start Time  1046    PT Stop Time  1140    PT Time Calculation (min)  54 min    Activity Tolerance  Patient tolerated treatment well    Behavior During Therapy  [facility] for tasks assessed/performed       Past Medical History:  Diagnosis Date  . Arthritis   . Breast cancer ([place]) 06/2018   left  . Diabetes mellitus ([place])   . Diverticulosis   . Hyperlipidemia   . Hypertension   . Migraines   . Obstructive sleep apnea    pressure is 14  . Osteoporosis   . Personal history of radiation therapy 2020   Left breast   . Rosacea   . Sleep apnea    daily c-pap  . Spinal stenosis     Past Surgical History:  Procedure Laterality Date  . BREAST LUMPECTOMY Left 07/08/2018  . BREAST LUMPECTOMY WITH RADIOACTIVE SEED LOCALIZATION Left 07/08/2018   Procedure: LEFT BREAST LUMPECTOMY WITH RADIOACTIVE SEED LOCALIZATION;  Surgeon: Fanny Skates, MD;  Location: [facility];  Service: General;  Laterality: Left;  . COLONOSCOPY    . KNEE ARTHROSCOPY     right torn meniscus  . POLYPECTOMY    . SHOULDER ARTHROSCOPY     left torn ligament  . TONSILLECTOMY      There were no vitals filed for this visit.  Subjective Assessment - 09/04/19 1049    Subjective  Patient reports current exercises are a little too much so she has reduced the number of  repetitions. States she is not having any wrist pain currently. She does report that her right shoulder and elbow have been bothering her more than the left wrist, this happened when she fractured her left wrist and had to do more with her right side. She is also having trouble with her balance and with any squatting tasks, and she feels that she would like to work on this as well. She is also having low back pain with referred pain down the left leg when this gets aggravated.    Limitations  House hold activities;Lifting;Writing;Standing;Walking    Patient Stated Goals  Improve mobility and strength of left wrist so she can play instruments, reduce right shoulder and elbow pain, improve balance to reduce fall risk    Currently in Pain?  Yes    Pain Score  0-No pain    Pain Location  Wrist    Pain Orientation  Left    Pain Type  Chronic pain    Pain Onset  More than a month ago    Pain Frequency  Intermittent    Aggravating Factors   Moving the wrist the wrong way, lifting, playing instruments    Pain Relieving Factors  Rest and stop aggravating activity    Effect of Pain  on Daily Activities  Patient has been avoiding using the left wrist with heavy activities    Multiple Pain Sites  Yes    Pain Score  0   5/10 with activity   Pain Location  Shoulder    Pain Orientation  Right    Pain Descriptors / Indicators  Aching;Tightness;Spasm    Pain Type  Chronic pain    Pain Radiating Towards  right elbow    Pain Onset  More than a month ago    Pain Frequency  Intermittent    Aggravating Factors   Lifting overhead, driving    Pain Relieving Factors  Stretching    Effect of Pain on Daily Activities  Shoulder and elbow pain interferes with lifting and driving activities    Pain Score  3    Pain Location  Knee    Pain Orientation  Right    Pain Descriptors / Indicators  Aching;Tightness    Pain Type  Chronic pain    Pain Onset  More than a month ago    Pain Frequency  Constant    Aggravating  Factors   Standing, walking, bending    Pain Relieving Factors  Rest    Effect of Pain on Daily Activities  Patient reports her knee doesn't work very well and she has to move slower         [facility] PT Assessment - 09/04/19 0001      Assessment   Medical Diagnosis  S/p closed extra-articular fracture of distal end of left radius    Referring Provider (PT)  Garald Balding, MD    Onset Date/Surgical Date  --   01/2019   Hand Dominance  Right    Next MD Visit  Not scheduled    Prior Therapy  No      Precautions   Precautions  None      Restrictions   Weight Bearing Restrictions  No      Balance Screen   Has the patient fallen in the past 6 months  Yes    How many times?  1 - twisted left ankle and fell on her back, 2 previous where she fractured her wrist    Has the patient had a decrease in activity level because of a fear of falling?   Yes    Is the patient reluctant to leave their home because of a fear of falling?   No      Home Environment   Living Environment  Private residence    Living Arrangements  Spouse/significant other    Type of [place] Access  Level entry    [place]  Two level   has chair lift     Prior Function   Level of Independence  Independent    Leisure  Playing instruments      Cognition   Overall Cognitive Status  Within Functional Limits for tasks assessed      Observation/Other Assessments   Observations  Patient appears in no apparent distress    Focus on Therapeutic Outcomes (FOTO)   NA      Functional Tests   Functional tests  Single leg stance;Sit to Stand      Single Leg Stance   Comments  Unable to maintain SL stance bilaterally, trendelenburg      Sit to Stand   Comments  Patient exhibits difficulty without use of UEs for assist, weight shift toward left  Posture/Postural Control   Posture Comments  Patient exhibits rounded shoulder and forward head posture      ROM / Strength   AROM / PROM / Strength   AROM;Strength      AROM   AROM Assessment Site  Wrist;Forearm;Shoulder;Elbow;Knee    Right/Left Shoulder  Right;Left    Right Shoulder Flexion  170 Degrees    Right Shoulder ABduction  160 Degrees    Right Shoulder Internal Rotation  --   T8   Right Shoulder External Rotation  70 Degrees   T4   Left Shoulder Flexion  140 Degrees   previous shoulder surgery   Left Shoulder ABduction  150 Degrees    Left Shoulder Internal Rotation  --   PSIS   Left Shoulder External Rotation  45 Degrees   T2   Right/Left Elbow  Right;Left    Right Elbow Flexion  140    Right Elbow Extension  0    Left Elbow Flexion  140    Left Elbow Extension  0    Left Forearm Supination  45 Degrees    Left Wrist Flexion  52 Degrees      Strength   Strength Assessment Site  Shoulder;Forearm;Wrist;Hand;Elbow;Hip;Knee    Right/Left Shoulder  Right;Left    Right Shoulder Flexion  4/5   patient reports right shoulder and elbow pain   Right Shoulder ABduction  4/5   patient reports right shoulder pain   Right Shoulder Internal Rotation  5/5    Right Shoulder External Rotation  4/5   patient reports right shoulder pain   Left Shoulder Flexion  4/5    Left Shoulder ABduction  4+/5    Left Shoulder Internal Rotation  5/5    Left Shoulder External Rotation  4+/5    Right/Left Elbow  Right;Left    Right Elbow Flexion  5/5   increased pain with pronation   Right Elbow Extension  5/5    Left Elbow Flexion  5/5    Left Elbow Extension  5/5    Right Forearm Pronation  5/5    Right Forearm Supination  5/5    Left Forearm Pronation  4+/5    Left Forearm Supination  4/5    Right Wrist Flexion  5/5    Right Wrist Extension  5/5   patient reports right elbow pain   Right Wrist Radial Deviation  5/5    Right Wrist Ulnar Deviation  5/5    Left Wrist Flexion  4/5    Left Wrist Extension  4/5    Left Wrist Radial Deviation  4+/5    Left Wrist Ulnar Deviation  4+/5    Right Hand Grip (lbs)  64, 65, 64   patient  reports right elbow pain   Left Hand Grip (lbs)  35, 36, 35    Right/Left Hip  Right;Left    Right Hip Flexion  4/5    Right Hip Extension  3+/5    Right Hip ABduction  3-/5    Left Hip Flexion  4/5    Left Hip Extension  3+/5    Left Hip ABduction  3-/5    Right/Left Knee  Right;Left    Right Knee Flexion  4+/5    Right Knee Extension  5/5    Left Knee Flexion  5/5    Left Knee Extension  5/5      Transfers   Transfers  Independent with all Transfers  [facility] Adult PT Treatment/Exercise - 09/04/19 0001      Exercises   Exercises  Wrist;Hand;Elbow;Knee/Hip;Shoulder      Elbow Exercises   Elbow Flexion  10 reps    Bar Weights/Barbell (Elbow Flexion)  2 lbs    Elbow Flexion Limitations  performed in supination, neutral, pronation      Knee/Hip Exercises: Supine   Bridges  10 reps    Straight Leg Raises  10 reps      Knee/Hip Exercises: Sidelying   Clams  x20      Shoulder Exercises: Standing   External Rotation  10 reps    Theraband Level (Shoulder External Rotation)  Level 1 (Yellow)    External Rotation Limitations  no money    Row  15 reps    Theraband Level (Shoulder Row)  Level 1 (Yellow)      Wrist Exercises   Wrist Flexion  20 reps    Bar Weights/Barbell (Wrist Flexion)  2 lbs    Wrist Extension  20 reps    Bar Weights/Barbell (Wrist Extension)  2 lbs             PT Education - 09/04/19 1100    Education Details  Reassessment findings, updated HEP    Person(s) Educated  Patient    Methods  Explanation;Demonstration;Tactile cues;Verbal cues;Handout    Comprehension  Verbalized understanding;Returned demonstration;Verbal cues required;Tactile cues required;Need further instruction       PT Short Term Goals - 09/04/19 1225      PT SHORT TERM GOAL #1   Title  Patient will be I with initial HEP to progress with PT    Time  4    Period  Weeks    Status  On-going    Target Date  09/08/19      PT SHORT TERM GOAL #2    Title  Patient will be able to perform light to moderate household activities without limitation or pain    Time  4    Period  Weeks    Status  On-going    Target Date  09/08/19        PT Long Term Goals - 09/04/19 1225      PT LONG TERM GOAL #1   Title  Patient will be I with final HEP to maintain progress from PT    Time  8    Period  Weeks    Status  New    Target Date  10/06/19      PT LONG TERM GOAL #2   Title  Patient will report improved functional level to </= 32% limitation on FOTO    Time  8    Period  Weeks    Status  New    Target Date  10/06/19      PT LONG TERM GOAL #3   Title  Patient will exhibit improve wrist strength to grossly >/= 4+/5 MMT and grip strength >/= 45 lbs to improve ability to lift heavy pots    Time  8    Period  Weeks    Status  New    Target Date  10/06/19      PT LONG TERM GOAL #4   Title  Patient will be able to perform usual hobbies including playing instruments with little to no difficulty    Time  8    Period  Weeks    Status  New    Target Date  10/06/19      PT  LONG TERM GOAL #5   Title  Patient will exhibit improved gross hip strength of >/= 4/5 MMT to improve balance and stability with walking and standing    Time  8    Period  Weeks    Status  New    Target Date  10/06/19      Additional Long Term Goals   Additional Long Term Goals  Yes      PT LONG TERM GOAL #6   Title  Patient will report </= 2/10 pain level with right shoulder and elbow activities such as driving and lifting    Time  8    Period  Weeks    Status  New    Target Date  10/06/19            Plan - 09/04/19 1104    Clinical Impression Statement  Patient arrives reporting improvement with left wrist but she is having more pain and limitation related to right shoulder and elbow, as well as balance deficits due to right knee and low back pain and general LE weakness. Patient was reassessed this visit to include right shoulder, elbow, and knee as  well as gait and balance deficits into plan of care. Patient does exhibit increased pain and strength deficit of the right shoulder and elbow, pain with right knee motion and bilateral hip strength deficits, and balance deficit. Her shoulder and elbow pain are most likely tendinopathy related from activity level where her right knee pain is most likely related to arthritis. She was provided with new exercises to include shoulder and elbow strengthening, and hip strengthening exercises with good tolerance. She would benefit from continued skilled PT to address the listed impairments in order to improve ability to perform all household tasks and walking without limitation.    Personal Factors and Comorbidities  Age;Fitness;Time since onset of injury/illness/exacerbation;Past/Current Experience    Examination-Activity Limitations  Locomotion Level;Reach Overhead;Squat;Stairs;Stand;Lift;Carry    Examination-Participation Restrictions  Meal Prep;Shop;Community Activity;Yard Work;Cleaning    Stability/Clinical Decision Making  Stable/Uncomplicated    Clinical Decision Making  Low    Rehab Potential  Good    PT Frequency  1x / week    PT Duration  8 weeks    PT Treatment/Interventions  ADLs/Self Care Home Management;Cryotherapy;Electrical Stimulation;Moist Heat;Therapeutic exercise;Therapeutic activities;Patient/family education;Manual techniques;Dry needling;Passive range of motion;Taping;Joint Manipulations;Stair training;Gait training;Functional mobility training;Balance training;Neuromuscular re-education    PT Next Visit Plan  Assess HEP and progress PRN, manual/mobs for wrist mobility, progress strengthening of wrist and hand, progress shoulder and elbow strengthening, hip and LE strengthening, balance training    PT Home Exercise Plan  [id number]: wrist flexion and extension with 1-2 lbs, pronation/supination with hammer, radial and ulnar deviation with hammer, putty hand grip, putty finger opposition;  bicep curl in supination, neutral, pronation; row, double ER; bridge, SLR, clamshell    Consulted and Agree with Plan of Care  Patient       Patient will benefit from skilled therapeutic intervention in order to improve the following deficits and impairments:  Decreased range of motion, Decreased strength, Decreased activity tolerance, Pain, Abnormal gait, Difficulty walking, Decreased balance, Improper body mechanics, Postural dysfunction  Visit Diagnosis: Pain in left wrist  Stiffness of left wrist, not elsewhere classified  Muscle weakness (generalized)  Chronic right shoulder pain  Pain in right elbow  Chronic pain of right knee  Other abnormalities of gait and mobility  Chronic bilateral low back pain, unspecified whether sciatica present     Problem List Patient  Active Problem List   Diagnosis Date Noted  . Spinal stenosis of lumbar region with neurogenic claudication 07/19/2019  . Spondylolisthesis of lumbar region 07/19/2019  . Unilateral primary osteoarthritis, right knee 07/19/2019  . Low back pain 06/27/2019  . Obesity 06/27/2019  . Closed fracture of left distal radius 06/01/2019  . Thumb pain, left 05/02/2019  . Malignant neoplasm of upper-outer quadrant of left breast in female, estrogen receptor positive ([place]) 06/13/2018    Hilda Blades, PT, DPT, LAT, ATC 09/04/19  1:04 PM Phone: [phone] Fax: [facility] [address] [place], [place], [address] Phone: [phone]   Fax:  [phone]  Name: Maansi Unzueta MRN: [id number] Date of Birth: 1946-02-12

## 2019-09-04 NOTE — Patient Instructions (Signed)
Access Code: [email or web address] URL: [email or web address] Date: 09/04/2019 Prepared by: Hilda Blades  Exercises Seated Wrist Extension with Dumbbell - 1 x daily - 7 x weekly - 10 reps - 2 sets Seated Wrist Flexion with Dumbbell - 1 x daily - 7 x weekly - 10 reps - 2 sets Forearm Pronation and Supination with Hammer - 1 x daily - 7 x weekly - 2 sets - 10 reps Standing Wrist Radial Deviation with Hammer - 1 x daily - 7 x weekly - 10 reps - 2 sets Standing Wrist Ulnar Deviation with Hammer - 1 x daily - 7 x weekly - 10 reps - 2 sets Putty Squeezes - 1 x daily - 7 x weekly - 10 reps - 2 sets Thumb Opposition with Putty - 1 x daily - 7 x weekly - 10 reps - 2 sets Standing Row with Anchored Resistance - 1 x daily - 7 x weekly - 2 sets - 20 reps Shoulder External Rotation and Scapular Retraction with Resistance - 1 x daily - 7 x weekly - 2 sets - 10 reps Standing Bicep Curls Supinated with Dumbbells - 1 x daily - 7 x weekly - 2 sets - 10 reps Standing Bicep Curls Neutral with Dumbbells - 1 x daily - 7 x weekly - 2 sets - 10 reps Standing Pronated Elbow Flexion with Dumbbell - 1 x daily - 7 x weekly - 2 sets - 10 reps Supine Bridge - 1 x daily - 7 x weekly - 2 sets - 10 reps Supine Active Straight Leg Raise - 1 x daily - 7 x weekly - 2 sets - 10 reps Clamshell - 1 x daily - 7 x weekly - 2 sets - 20 reps

## 2019-09-04 NOTE — Telephone Encounter (Signed)
Please advise 

## 2019-09-11 ENCOUNTER — Encounter: Payer: Medicare Other | Admitting: Physical Therapy

## 2019-09-17 ENCOUNTER — Other Ambulatory Visit: Payer: Self-pay

## 2019-09-17 ENCOUNTER — Ambulatory Visit
Admission: EM | Admit: 2019-09-17 | Discharge: 2019-09-17 | Disposition: A | Discharge status: Home / Self Care | Payer: Medicare Other

## 2019-09-17 ENCOUNTER — Encounter: Payer: Self-pay | Admitting: Physician Assistant

## 2019-09-17 DIAGNOSIS — L299 Pruritus, unspecified: Secondary | ICD-10-CM | POA: Diagnosis not present

## 2019-09-17 DIAGNOSIS — W57XXXA Bitten or stung by nonvenomous insect and other nonvenomous arthropods, initial encounter: Secondary | ICD-10-CM

## 2019-09-17 MED ORDER — TRIAMCINOLONE ACETONIDE 0.1 % EX CREA: 1.0000 "application " | TOPICAL_CREAM | Freq: Two times a day (BID) | CUTANEOUS | 0 refills | Status: AC

## 2019-09-17 NOTE — ED Provider Notes (Signed)
[facility]    CSN: [id number] Arrival date & time: 09/17/19  1035      History   Chief Complaint Chief Complaint  Patient presents with  . Insect Bite    removed at home 3-4 days ago    HPI Linda Li is a 74 y.o. female.   74 year old female comes in for redness around tick bite wound. Tick was not engorged, but unsure length of attachment. States removed to take 3 to 4 days ago to posterior leg.  Since then, has had itching to the area.  Noticed significant redness today, and therefore came in for evaluation.  Denies any pain, increased swelling, increased warmth.  Denies fever, chills, body aches.  Denies malaise, headache, other rashes.     Past Medical History:  Diagnosis Date  . Arthritis   . Breast cancer ([place]) 06/2018   left  . Diabetes mellitus ([place])   . Diverticulosis   . Hyperlipidemia   . Hypertension   . Migraines   . Obstructive sleep apnea    pressure is 14  . Osteoporosis   . Personal history of radiation therapy 2020   Left breast   . Rosacea   . Sleep apnea    daily c-pap  . Spinal stenosis     Patient Active Problem List   Diagnosis Date Noted  . Spinal stenosis of lumbar region with neurogenic claudication 07/19/2019  . Spondylolisthesis of lumbar region 07/19/2019  . Unilateral primary osteoarthritis, right knee 07/19/2019  . Low back pain 06/27/2019  . Obesity 06/27/2019  . Closed fracture of left distal radius 06/01/2019  . Thumb pain, left 05/02/2019  . Malignant neoplasm of upper-outer quadrant of left breast in female, estrogen receptor positive ([place]) 06/13/2018    Past Surgical History:  Procedure Laterality Date  . BREAST LUMPECTOMY Left 07/08/2018  . BREAST LUMPECTOMY WITH RADIOACTIVE SEED LOCALIZATION Left 07/08/2018   Procedure: LEFT BREAST LUMPECTOMY WITH RADIOACTIVE SEED LOCALIZATION;  Surgeon: Fanny Skates, MD;  Location: [facility];  Service: General;  Laterality: Left;  .  COLONOSCOPY    . KNEE ARTHROSCOPY     right torn meniscus  . POLYPECTOMY    . SHOULDER ARTHROSCOPY     left torn ligament  . TONSILLECTOMY      OB History   No obstetric history on file.      Home Medications    Prior to Admission medications   Medication Sig Start Date End Date Taking? Authorizing Provider  metFORMIN (GLUCOPHAGE-XR) 500 MG 24 hr tablet Take 500 mg by mouth daily with breakfast.   Yes [provider]  anastrozole (ARIMIDEX) 1 MG tablet TAKE 1 TABLET BY MOUTH EVERY DAY 03/13/19   Nicholas Lose, MD  aspirin EC 81 MG tablet Take 81 mg by mouth daily.    [provider]  Azelaic Acid 15 % cream Apply topically. 12/17/11   [provider]  Calcium Citrate 200 MG TABS Take 500 mg by mouth.    [provider]  Cholecalciferol (VITAMIN D3) 1000 UNITS CAPS Take 2,000 Units by mouth.    [provider]  Coenzyme Q10 (COQ10) 100 MG CAPS Take 100 mg by mouth.    [provider]  Cyanocobalamin ([employer] VITAMIN B-12) 5000 MCG SUBL Place under the tongue.    [provider]  diazepam (VALIUM) 5 MG tablet Take 1 by mouth 1 hour  pre-procedure with very light food. May bring 2nd tablet to appointment. 08/03/19   Ernestina Patches,  Haze Rushing, MD  escitalopram (LEXAPRO) 10 MG tablet TAKE 1 TABLET BY MOUTH EVERY DAY 05/09/19   Nicholas Lose, MD  gabapentin (NEURONTIN) 300 MG capsule Take 300 mg by mouth at bedtime. 03/20/19   [provider]  glucose blood test strip CHECK BLOOD SUGAR DAILY. 06/16/13   [provider]  lisinopril (PRINIVIL,ZESTRIL) 10 MG tablet Take 20 mg by mouth.  03/19/14   [provider]  Magnesium 400 MG CAPS Take by mouth.    [provider]  meloxicam (MOBIC) 15 MG tablet TAKE 1 TABLET BY MOUTH EVERY DAY WITH FOOD 09/04/19   Magnus Sinning, MD  montelukast (SINGULAIR) 10 MG tablet Take 10 mg by mouth daily. 04/21/19   [provider]  Omega-3 Fatty Acids (FISH OIL) 1000 MG  CAPS Take 1 capsule by mouth.    [provider]  OVER THE COUNTER MEDICATION Take 1 tablet by mouth at bedtime. 5-HTP Plus (Natrol)    [provider]  OVER THE COUNTER MEDICATION Take 1 tablet by mouth at bedtime. Ashwaganda root.    [provider]  SUMAtriptan (IMITREX) 100 MG tablet 1 po immediately at onset of migraine; may repeat in 2 hrs if HA persists; no more than 2 in 24 hrs 03/19/14   [provider]  triamcinolone cream (KENALOG) 0.1 % Apply 1 application topically 2 (two) times daily. 09/17/19   Tasia Catchings, Juvenal Umar V, PA-C  TURMERIC PO Take by mouth.    [provider]  UNABLE TO FIND Take 2 capsules by mouth daily. [place]    [provider]  vitamin C (ASCORBIC ACID) 500 MG tablet Take 500 mg by mouth.    [provider]    Family History Family History  Problem Relation Age of Onset  . Throat cancer Father   . Esophageal cancer Father   . Colon cancer Neg Hx   . Rectal cancer Neg Hx   . Stomach cancer Neg Hx     Social History Social History   Tobacco Use  . Smoking status: Never Smoker  . Smokeless tobacco: Never Used  Substance Use Topics  . Alcohol use: No    Alcohol/week: 0.0 standard drinks  . Drug use: No     Allergies   Shellfish-derived products   Review of Systems Review of Systems  Reason unable to perform ROS: See HPI as above.     Physical Exam Triage Vital Signs ED Triage Vitals  Enc Vitals Group     BP 09/17/19 1042 129/74     Pulse Rate 09/17/19 1042 80     Resp 09/17/19 1042 18     Temp 09/17/19 1042 98 F (36.7 C)     Temp Source 09/17/19 1042 Oral     SpO2 09/17/19 1042 93 %     Weight --      Height --      Head Circumference --      Peak Flow --      Pain Score 09/17/19 1055 0     Pain Loc --      Pain Edu? --      Excl. in [place]? --    No data found.  Updated Vital Signs BP 129/74 (BP Location: Right Arm)   Pulse 80   Temp 98 F (36.7 C) (Oral)   Resp 18   SpO2  93%   Visual Acuity Right Eye Distance:   Left Eye Distance:   Bilateral Distance:    Right Eye Near:  Left Eye Near:    Bilateral Near:     Physical Exam Constitutional:      General: She is not in acute distress.    Appearance: Normal appearance. She is well-developed. She is not toxic-appearing or diaphoretic.  HENT:     Head: Normocephalic and atraumatic.  Eyes:     Conjunctiva/sclera: Conjunctivae normal.     Pupils: Pupils are equal, round, and reactive to light.  Pulmonary:     Effort: Pulmonary effort is normal. No respiratory distress.     Comments: Speaking in full sentences without difficulty Musculoskeletal:     Cervical back: Normal range of motion and neck supple.  Skin:    General: Skin is warm and dry.     Comments: 3cm x 3cm nonraised erythematous lesion to the right posterior leg, directly inferior to the knee. No warmth, tenderness to palpation. No induration, fluctuance felt  Neurological:     Mental Status: She is alert and oriented to person, place, and time.      UC Treatments / Results  Labs (all labs ordered are listed, but only abnormal results are displayed) Labs Reviewed - No data to display  EKG   Radiology No results found.  Procedures Procedures (including critical care time)  Medications Ordered in UC Medications - No data to display  Initial Impression / Assessment and Plan / UC Course  I have reviewed the triage vital signs and the nursing notes.  Pertinent labs & imaging results that were available during my care of the patient were reviewed by me and considered in my medical decision making (see chart for details).    No signs of cellulitis.  Erythema likely due to itching/scratching.  Will provide triamcinolone cream at this time.  Antihistamine for itching.  Return precautions given.  Patient expresses understanding and agrees to plan.  Final Clinical Impressions(s) / UC Diagnoses   Final diagnoses:  Itching  Tick  bite, initial encounter   ED Prescriptions    Medication Sig Dispense Auth. Provider   triamcinolone cream (KENALOG) 0.1 % Apply 1 application topically 2 (two) times daily. 30 g Ok Edwards, PA-C     PDMP not reviewed this encounter.   Ok Edwards, PA-C 09/17/19 1255

## 2019-09-17 NOTE — ED Triage Notes (Addendum)
Patient presents for evaluation of area on back of right leg where she was bit by a tick 3-4 days ago. She reports this reaction is a bit worse than those in the past. She denies fever or joint pain that might be related to the bite.

## 2019-09-17 NOTE — Discharge Instructions (Signed)
Start triamcinolone on affected area. Start benadryl, zyrtec type medicine for itching. Ice compress if needed for itching. Monitor for spreading redness, warmth, fever, swelling, pain, follow up for reevaluation. If developing headache, fever, rash, body aches, joint pain, follow up for reevaluation needed.

## 2019-09-18 ENCOUNTER — Encounter: Payer: Self-pay | Admitting: Physical Therapy

## 2019-09-18 ENCOUNTER — Other Ambulatory Visit: Payer: Self-pay

## 2019-09-18 ENCOUNTER — Ambulatory Visit: Payer: Medicare Other | Attending: Orthopaedic Surgery | Admitting: Physical Therapy

## 2019-09-18 DIAGNOSIS — M545 Low back pain: Secondary | ICD-10-CM | POA: Diagnosis present

## 2019-09-18 DIAGNOSIS — R2689 Other abnormalities of gait and mobility: Secondary | ICD-10-CM | POA: Diagnosis present

## 2019-09-18 DIAGNOSIS — M25561 Pain in right knee: Secondary | ICD-10-CM | POA: Insufficient documentation

## 2019-09-18 DIAGNOSIS — M6281 Muscle weakness (generalized): Secondary | ICD-10-CM

## 2019-09-18 DIAGNOSIS — G8929 Other chronic pain: Secondary | ICD-10-CM

## 2019-09-18 DIAGNOSIS — M25511 Pain in right shoulder: Secondary | ICD-10-CM | POA: Insufficient documentation

## 2019-09-18 DIAGNOSIS — M25521 Pain in right elbow: Secondary | ICD-10-CM | POA: Diagnosis present

## 2019-09-18 DIAGNOSIS — M25632 Stiffness of left wrist, not elsewhere classified: Secondary | ICD-10-CM | POA: Diagnosis present

## 2019-09-18 DIAGNOSIS — M25532 Pain in left wrist: Secondary | ICD-10-CM

## 2019-09-18 NOTE — Patient Instructions (Signed)
Access Code: [email or web address] URL: [email or web address] Date: 09/18/2019 Prepared by: Hilda Blades  Exercises Seated Wrist Extension with Dumbbell - 1 x daily - 3 x weekly - 10 reps - 2 sets Seated Wrist Flexion with Dumbbell - 1 x daily - 3 x weekly - 10 reps - 2 sets Forearm Pronation and Supination with Hammer - 1 x daily - 3 x weekly - 2 sets - 10 reps Standing Wrist Radial Deviation with Hammer - 1 x daily - 3 x weekly - 10 reps - 2 sets Standing Wrist Ulnar Deviation with Hammer - 1 x daily - 3 x weekly - 10 reps - 2 sets Putty Squeezes - 1 x daily - 3 x weekly - 10 reps - 2 sets Thumb Opposition with Putty - 1 x daily - 3 x weekly - 10 reps - 2 sets Standing Row with Anchored Resistance - 1 x daily - 3 x weekly - 2 sets - 20 reps Scapular Retraction with Resistance Advanced - 1 x daily - 3 x weekly - 2 sets - 10 reps Shoulder External Rotation and Scapular Retraction with Resistance - 1 x daily - 3 x weekly - 2 sets - 10 reps Standing Bicep Curls Supinated with Dumbbells - 1 x daily - 3 x weekly - 2 sets - 10 reps Standing Bicep Curls Neutral with Dumbbells - 1 x daily - 3 x weekly - 2 sets - 10 reps Standing Pronated Elbow Flexion with Dumbbell - 1 x daily - 3 x weekly - 2 sets - 10 reps Gentle Upper Trap Stretch - 1 x daily - 3 x weekly - 2 reps - 20 seconds hold Gentle Levator Scapulae Stretch - 1 x daily - 3 x weekly - 2 reps - 20 seconds hold Seated Shoulder Flexion AAROM with Pulley Behind - 1 x daily - 3 x weekly Supine Bridge - 1 x daily - 3 x weekly - 2 sets - 10 reps Supine Active Straight Leg Raise - 1 x daily - 3 x weekly - 2 sets - 10 reps Hooklying Clamshell with Resistance - 1 x daily - 3 x weekly - 2 sets - 10 reps Supine Lower Trunk Rotation - 1 x daily - 3 x weekly - 2 sets - 10 reps Sit to Stand - 1 x daily - 3 x weekly - 2 sets - 10 reps

## 2019-09-18 NOTE — Therapy (Addendum)
[place], [place], [address] Phone: [phone]   Fax:  [phone]  Physical Therapy Treatment / Discharge  Patient Details  Name: Linda Li MRN: [id number] Date of Birth: [birth year] Referring Provider (PT): Garald Balding, MD   Encounter Date: 09/18/2019  PT End of Session - 09/18/19 1207    Visit Number  4    Number of Visits  8    Date for PT Re-Evaluation  10/06/19    Authorization Type  [employer] MCR    PT Start Time  1210    PT Stop Time  1250    PT Time Calculation (min)  40 min    Activity Tolerance  Patient tolerated treatment well    Behavior During Therapy  [facility] for tasks assessed/performed       Past Medical History:  Diagnosis Date  . Arthritis   . Breast cancer ([place]) 06/2018   left  . Diabetes mellitus ([place])   . Diverticulosis   . Hyperlipidemia   . Hypertension   . Migraines   . Obstructive sleep apnea    pressure is 14  . Osteoporosis   . Personal history of radiation therapy 2020   Left breast   . Rosacea   . Sleep apnea    daily c-pap  . Spinal stenosis     Past Surgical History:  Procedure Laterality Date  . BREAST LUMPECTOMY Left 07/08/2018  . BREAST LUMPECTOMY WITH RADIOACTIVE SEED LOCALIZATION Left 07/08/2018   Procedure: LEFT BREAST LUMPECTOMY WITH RADIOACTIVE SEED LOCALIZATION;  Surgeon: Fanny Skates, MD;  Location: [place];  Service: General;  Laterality: Left;  . COLONOSCOPY    . KNEE ARTHROSCOPY     right torn meniscus  . POLYPECTOMY    . SHOULDER ARTHROSCOPY     left torn ligament  . TONSILLECTOMY      There were no vitals filed for this visit.  Subjective Assessment - 09/18/19 1213    Subjective  Patient reports she has not been consistent with exercises at home. She did report that she had some left hip and thigh pain following last visit so she was nervous to do any of the exercises.    Patient Stated Goals  Improve  mobility and strength of left wrist so she can play instruments, reduce right shoulder and elbow pain, improve balance to reduce fall risk    Pain Score  0   gets worse with driving   Pain Location  Shoulder    Pain Orientation  Right    Pain Descriptors / Indicators  Aching    Pain Type  Chronic pain    Pain Onset  More than a month ago    Pain Frequency  Intermittent    Aggravating Factors   Driving                       [facility] Adult PT Treatment/Exercise - 09/18/19 0001      Exercises   Exercises  Wrist;Hand;Elbow;Knee/Hip;Shoulder      Knee/Hip Exercises: Stretches   Other Knee/Hip Stretches  LTR x10 each      Knee/Hip Exercises: Aerobic   Nustep  L4 x 5 min with UE/LE      Knee/Hip Exercises: Seated   Sit to Sand  10 reps      Knee/Hip Exercises: Supine   Bridges  10 reps    Straight Leg Raises  10 reps    Other Supine  Knee/Hip Exercises  Clamshell with red band     Other Supine Knee/Hip Exercises  Marching alternating x20      Shoulder Exercises: Standing   External Rotation  10 reps    Theraband Level (Shoulder External Rotation)  Level 2 (Red)    Extension  10 reps    Theraband Level (Shoulder Extension)  Level 2 (Red)    Row  10 reps    Theraband Level (Shoulder Row)  Level 2 (Red)             PT Education - 09/18/19 1207    Education Details  HEP update    Person(s) Educated  Patient    Methods  Explanation;Demonstration;Tactile cues;Verbal cues;Handout    Comprehension  Verbalized understanding;Returned demonstration;Verbal cues required;Tactile cues required;Need further instruction       PT Short Term Goals - 09/04/19 1225      PT SHORT TERM GOAL #1   Title  Patient will be I with initial HEP to progress with PT    Time  4    Period  Weeks    Status  On-going    Target Date  09/08/19      PT SHORT TERM GOAL #2   Title  Patient will be able to perform light to moderate household activities without limitation or pain    Time   4    Period  Weeks    Status  On-going    Target Date  09/08/19        PT Long Term Goals - 09/04/19 1225      PT LONG TERM GOAL #1   Title  Patient will be I with final HEP to maintain progress from PT    Time  8    Period  Weeks    Status  New    Target Date  10/06/19      PT LONG TERM GOAL #2   Title  Patient will report improved functional level to </= 32% limitation on FOTO    Time  8    Period  Weeks    Status  New    Target Date  10/06/19      PT LONG TERM GOAL #3   Title  Patient will exhibit improve wrist strength to grossly >/= 4+/5 MMT and grip strength >/= 45 lbs to improve ability to lift heavy pots    Time  8    Period  Weeks    Status  New    Target Date  10/06/19      PT LONG TERM GOAL #4   Title  Patient will be able to perform usual hobbies including playing instruments with little to no difficulty    Time  8    Period  Weeks    Status  New    Target Date  10/06/19      PT LONG TERM GOAL #5   Title  Patient will exhibit improved gross hip strength of >/= 4/5 MMT to improve balance and stability with walking and standing    Time  8    Period  Weeks    Status  New    Target Date  10/06/19      Additional Long Term Goals   Additional Long Term Goals  Yes      PT LONG TERM GOAL #6   Title  Patient will report </= 2/10 pain level with right shoulder and elbow activities such as driving and lifting    Time  8    Period  Weeks    Status  New    Target Date  10/06/19            Plan - 09/18/19 1209    Clinical Impression Statement  Patient tolerated therapy well with no adverse effects. She reported anxiety with exercises so her HEP was adjusted and she did well with her exercises this visit without an increase in pain level. She was instructed to perform exercises less frequent and her HEP as divided into wrist. shoulder/elbow, and back/LE so she would perform these exercises together on a specific day to allow a few days of recovery. She  would benefit from continued skilled PT to improve ability to perform all household tasks and walking without limitation.    PT Treatment/Interventions  ADLs/Self Care Home Management;Cryotherapy;Electrical Stimulation;Moist Heat;Therapeutic exercise;Therapeutic activities;Patient/family education;Manual techniques;Dry needling;Passive range of motion;Taping;Joint Manipulations;Stair training;Gait training;Functional mobility training;Balance training;Neuromuscular re-education    PT Next Visit Plan  Assess HEP and progress PRN, manual/mobs for wrist mobility, progress strengthening of wrist and hand, progress shoulder and elbow strengthening, hip and LE strengthening, balance training    PT Home Exercise Plan  [id number]: wrist flexion and extension with 1-2 lbs, pronation/supination with hammer, radial and ulnar deviation with hammer, putty hand grip, putty finger opposition; bicep curl in supination, neutral, pronation; row, extension double ER, upper trap and levator stretch; bridge, SLR, supine clamshell, sit<>stand    Consulted and Agree with Plan of Care  Patient       Patient will benefit from skilled therapeutic intervention in order to improve the following deficits and impairments:  Decreased range of motion, Decreased strength, Decreased activity tolerance, Pain, Abnormal gait, Difficulty walking, Decreased balance, Improper body mechanics, Postural dysfunction  Visit Diagnosis: Pain in left wrist  Stiffness of left wrist, not elsewhere classified  Muscle weakness (generalized)  Chronic right shoulder pain  Pain in right elbow  Chronic pain of right knee  Other abnormalities of gait and mobility  Chronic bilateral low back pain, unspecified whether sciatica present     Problem List Patient Active Problem List   Diagnosis Date Noted  . Spinal stenosis of lumbar region with neurogenic claudication 07/19/2019  . Spondylolisthesis of lumbar region 07/19/2019  . Unilateral  primary osteoarthritis, right knee 07/19/2019  . Low back pain 06/27/2019  . Obesity 06/27/2019  . Closed fracture of left distal radius 06/01/2019  . Thumb pain, left 05/02/2019  . Malignant neoplasm of upper-outer quadrant of left breast in female, estrogen receptor positive ([place]) 06/13/2018    Hilda Blades, PT, DPT, LAT, ATC 09/18/19  1:03 PM Phone: [phone] Fax: [facility] [address] [place], [place], [address] Phone: [phone]   Fax:  [phone]  Name: Linda Li [facility] MRN: [id number] Date of Birth: 09-Jan-1946    PHYSICAL THERAPY DISCHARGE SUMMARY  Visits from Start of Care: 4  Current functional level related to goals / functional outcomes: See above   Remaining deficits: See above   Education / Equipment: HEP Plan: Patient agrees to discharge.  Patient goals were not met. Patient is being discharged due to not returning since the last visit.  ?????    Hilda Blades, PT, DPT, LAT, ATC 10/19/19  2:08 PM Phone: [phone] Fax: [phone]

## 2019-09-25 ENCOUNTER — Ambulatory Visit: Payer: Medicare Other | Admitting: Physical Therapy

## 2019-09-28 ENCOUNTER — Other Ambulatory Visit: Payer: Self-pay | Admitting: Physical Medicine and Rehabilitation

## 2019-09-28 NOTE — Telephone Encounter (Signed)
Please advise 

## 2019-10-02 ENCOUNTER — Encounter: Payer: Medicare Other | Admitting: Physical Therapy

## 2019-10-09 ENCOUNTER — Encounter: Payer: Medicare Other | Admitting: Physical Therapy

## 2019-11-17 ENCOUNTER — Telehealth: Payer: Self-pay | Admitting: Adult Health

## 2019-11-17 NOTE — Telephone Encounter (Signed)
Called patient and reviewed bone density.  We discussed bisphosphanate use.  Since she has fractured her left forearm and that was used to do the bone density, she would like to work on her exercises and forego bisphophanate therapy for one year.    We will repeat bone density in one year.    Wilber Bihari, NP

## 2019-12-02 ENCOUNTER — Other Ambulatory Visit: Payer: Self-pay | Admitting: Hematology and Oncology

## 2020-05-13 ENCOUNTER — Other Ambulatory Visit: Payer: Self-pay | Admitting: Adult Health

## 2020-05-13 DIAGNOSIS — Z9889 Other specified postprocedural states: Secondary | ICD-10-CM

## 2020-06-20 ENCOUNTER — Ambulatory Visit
Admission: RE | Admit: 2020-06-20 | Discharge: 2020-06-20 | Disposition: A | Discharge status: Home / Self Care | Payer: Medicare Other | Source: Ambulatory Visit | Attending: Adult Health | Admitting: Adult Health

## 2020-06-20 ENCOUNTER — Other Ambulatory Visit: Payer: Self-pay

## 2020-06-20 DIAGNOSIS — Z9889 Other specified postprocedural states: Secondary | ICD-10-CM

## 2020-07-08 NOTE — Assessment & Plan Note (Signed)
07/08/2018: Left lumpectomy: Grade 2 IDC, 1.3 cm, margins negative, negative for lymphovascular or perineural invasion, ER 100%, PR 90%, HER-2 -1+, Ki-67 3%, T1CNX stage Ia Oncotype DX recurrence score 4, risk of distant recurrence: 3%, low risk Adjuvant radiation therapy started 11/04/2018 Adjuvant antiestrogen therapy started April 2020  Anastrozole toxicities: Severe hot flashes: Continues to be the same.  Breast cancer surveillance: 1.  Breast exam 07/09/2020: Benign 2.  Mammogram 06/20/20: Benign breast density category B  Severe arthritis: Patient is seeing a pain physician.  Return to clinic in 1 year for follow-up

## 2020-07-08 NOTE — Progress Notes (Signed)
Patient Care Team: Katherina Mires, MD as PCP - General (Family Medicine) Fanny Skates, MD as Consulting Physician (General Surgery) Nicholas Lose, MD as Consulting Physician (Hematology and Oncology) Gery Pray, MD as Consulting Physician (Radiation Oncology)  DIAGNOSIS:    ICD-10-CM   1. Malignant neoplasm of upper-outer quadrant of left breast in female, estrogen receptor positive ([place])  C50.412    Z17.0     SUMMARY OF ONCOLOGIC HISTORY: Oncology History  Malignant neoplasm of upper-outer quadrant of left breast in female, estrogen receptor positive ([place])  06/13/2018 Initial Diagnosis   Screening detected left breast asymmetry with distortion, ultrasound 8 mm at 12 o'clock position, axilla negative, ultrasound-guided biopsy revealed grade 1 IDC with DCIS ER 100%, PR 100%, HER-2 -1+ by IHC, Ki-67 3%, T1BN0 stage Ia clinical stage   07/08/2018 Surgery   Left lumpectomy: Grade 2 IDC, 1.3 cm, margins negative, negative for lymphovascular or perineural invasion, ER 100%, PR 90%, HER-2 -1+, Ki-67 3%, T1CNX stage Ia    07/20/2018 Cancer Staging   Staging form: Breast, AJCC 8th Edition - Pathologic: Stage IA (pT1c, pN0, cM0, G2, ER+, PR+, HER2-) - Signed by Gardenia Phlegm, NP on 07/20/2018   07/25/2018 Oncotype testing   Oncotype DX recurrence score 4, distant recurrence risk at 9 years: 3%, low risk   08/23/2018 -  Anti-estrogen oral therapy   Anastrozole 1 mg daily   11/01/2018 - 11/30/2018 Radiation Therapy   Adjuvant radiation therapy: 1. Left Breast / 40.05 Gy in 15 fractions 2. Boost / 10 Gy in 5 fractions     CHIEF COMPLIANT: Follow-up of left breast cancer on anastrozole   INTERVAL HISTORY: Linda Li is a 75 y.o. with above-mentioned history of left breast cancer who underwent a lumpectomy, radiation, and is currently on anastrozole.Bone density scan on 08/21/19 showed osteoporosis with a T-score of -3.9 at the forearm radius. Mammogram on 06/20/20 showed  no evidence of malignancy bilaterally. She presents to the clinic today for annual follow-up.    She has chronic arthritis in the lower back and hips and knees.  Because of this she is not able to exercise regularly.  ALLERGIES:  is allergic to shellfish-derived products.  MEDICATIONS:  Current Outpatient Medications  Medication Sig Dispense Refill  . anastrozole (ARIMIDEX) 1 MG tablet TAKE 1 TABLET BY MOUTH EVERY DAY 90 tablet 2  . aspirin EC 81 MG tablet Take 81 mg by mouth daily.    . Azelaic Acid 15 % cream Apply topically.    . Calcium Citrate 200 MG TABS Take 500 mg by mouth.    . Cholecalciferol (VITAMIN D3) 1000 UNITS CAPS Take 2,000 Units by mouth.    . Coenzyme Q10 (COQ10) 100 MG CAPS Take 100 mg by mouth.    . Cyanocobalamin ([employer] VITAMIN B-12) 5000 MCG SUBL Place under the tongue.    . diazepam (VALIUM) 5 MG tablet Take 1 by mouth 1 hour  pre-procedure with very light food. May bring 2nd tablet to appointment. 2 tablet 0  . escitalopram (LEXAPRO) 10 MG tablet TAKE 1 TABLET BY MOUTH EVERY DAY 90 tablet 0  . gabapentin (NEURONTIN) 300 MG capsule Take 300 mg by mouth at bedtime.    Marland Kitchen glucose blood test strip CHECK BLOOD SUGAR DAILY.    Marland Kitchen lisinopril (PRINIVIL,ZESTRIL) 10 MG tablet Take 20 mg by mouth.     . Magnesium 400 MG CAPS Take by mouth.    . meloxicam (MOBIC) 15 MG tablet TAKE 1 TABLET BY  MOUTH EVERY DAY WITH FOOD 30 tablet 0  . metFORMIN (GLUCOPHAGE-XR) 500 MG 24 hr tablet Take 500 mg by mouth daily with breakfast.    . montelukast (SINGULAIR) 10 MG tablet Take 10 mg by mouth daily.    . Omega-3 Fatty Acids (FISH OIL) 1000 MG CAPS Take 1 capsule by mouth.    Marland Kitchen OVER THE COUNTER MEDICATION Take 1 tablet by mouth at bedtime. 5-HTP Plus (Natrol)    . OVER THE COUNTER MEDICATION Take 1 tablet by mouth at bedtime. Ashwaganda root.    . SUMAtriptan (IMITREX) 100 MG tablet 1 po immediately at onset of migraine; may repeat in 2 hrs if HA persists; no more than 2 in 24 hrs    .  triamcinolone cream (KENALOG) 0.1 % Apply 1 application topically 2 (two) times daily. 30 g 0  . TURMERIC PO Take by mouth.    Marland Kitchen UNABLE TO FIND Take 2 capsules by mouth daily. CINSULIN    . vitamin C (ASCORBIC ACID) 500 MG tablet Take 500 mg by mouth.     Current Facility-Administered Medications  Medication Dose Route Frequency Provider Last Rate Last Admin  . lidocaine (PF) (XYLOCAINE) 1 % injection 0.3 mL  0.3 mL Other Once Magnus Sinning, MD        PHYSICAL EXAMINATION: ECOG PERFORMANCE STATUS: 1 - Symptomatic but completely ambulatory  Vitals:   07/09/20 0859  BP: (!) 150/64  Pulse: 77  Resp: 17  Temp: 97.7 F (36.5 C)  SpO2: 97%   Filed Weights   07/09/20 0859  Weight: 270 lb 1.6 oz (122.5 kg)       LABORATORY DATA:  I have reviewed the data as listed CMP Latest Ref Rng & Units 06/15/2018  Glucose 70 - 99 mg/dL 132(H)  BUN 8 - 23 mg/dL 19  Creatinine 0.44 - 1.00 mg/dL 0.75  Sodium 135 - 145 mmol/L 140  Potassium 3.5 - 5.1 mmol/L 4.4  Chloride 98 - 111 mmol/L 104  CO2 22 - 32 mmol/L 24  Calcium 8.9 - 10.3 mg/dL 10.0  Total Protein 6.5 - 8.1 g/dL 7.5  Total Bilirubin 0.3 - 1.2 mg/dL 0.3  Alkaline Phos 38 - 126 U/L 97  AST 15 - 41 U/L 14(L)  ALT 0 - 44 U/L 19    Lab Results  Component Value Date   WBC 9.4 06/15/2018   HGB 14.9 06/15/2018   HCT 43.2 06/15/2018   MCV 88.0 06/15/2018   PLT 251 06/15/2018   NEUTROABS 4.9 06/15/2018    ASSESSMENT & PLAN:  Malignant neoplasm of upper-outer quadrant of left breast in female, estrogen receptor positive ([place]) 07/08/2018: Left lumpectomy: Grade 2 IDC, 1.3 cm, margins negative, negative for lymphovascular or perineural invasion, ER 100%, PR 90%, HER-2 -1+, Ki-67 3%, T1CNX stage Ia Oncotype DX recurrence score 4, risk of distant recurrence: 3%, low risk Adjuvant radiation therapy started 11/04/2018 Adjuvant antiestrogen therapy started April 2020  Anastrozole toxicities: Severe hot flashes: Continues to be the  same.  Breast cancer surveillance: 1.  Breast exam 07/09/2020: Benign 2.  Mammogram 06/20/20: Benign breast density category B  Severe arthritis: Patient is seeing a pain physician.  Return to clinic in 1 year for follow-up     No orders of the defined types were placed in this encounter.  The patient has a good understanding of the overall plan. she agrees with it. she will call with any problems that may develop before the next visit here.  Total time spent: 20  mins including face to face time and time spent for planning, charting and coordination of care  Rulon Eisenmenger, MD, MPH 07/09/2020  I, Molly Dorshimer, am acting as scribe for Dr. Nicholas Lose.  I have reviewed the above documentation for accuracy and completeness, and I agree with the above.

## 2020-07-09 ENCOUNTER — Inpatient Hospital Stay: Payer: Medicare Other | Attending: Hematology and Oncology | Admitting: Hematology and Oncology

## 2020-07-09 ENCOUNTER — Other Ambulatory Visit: Payer: Self-pay

## 2020-07-09 DIAGNOSIS — C50412 Malignant neoplasm of upper-outer quadrant of left female breast: Secondary | ICD-10-CM

## 2020-07-09 DIAGNOSIS — R232 Flushing: Secondary | ICD-10-CM | POA: Insufficient documentation

## 2020-07-09 DIAGNOSIS — Z79811 Long term (current) use of aromatase inhibitors: Secondary | ICD-10-CM | POA: Diagnosis not present

## 2020-07-09 DIAGNOSIS — Z170 Estrogen receptor positive status [ER+]: Secondary | ICD-10-CM | POA: Diagnosis not present

## 2020-07-09 MED ORDER — ALENDRONATE SODIUM 70 MG PO TABS: 70.0000 mg | ORAL_TABLET | ORAL | 3 refills | Status: DC

## 2020-09-01 ENCOUNTER — Other Ambulatory Visit: Payer: Self-pay | Admitting: Hematology and Oncology

## 2020-10-01 ENCOUNTER — Other Ambulatory Visit: Payer: Self-pay | Admitting: *Deleted

## 2020-10-01 MED ORDER — ANASTROZOLE 1 MG PO TABS: 1.0000 mg | ORAL_TABLET | Freq: Every day | ORAL | 2 refills | Status: DC

## 2020-10-01 MED ORDER — ALENDRONATE SODIUM 70 MG PO TABS: 70.0000 mg | ORAL_TABLET | ORAL | 3 refills | Status: DC

## 2020-11-04 ENCOUNTER — Encounter: Payer: Self-pay | Admitting: Gastroenterology

## 2021-01-16 ENCOUNTER — Ambulatory Visit (AMBULATORY_SURGERY_CENTER): Payer: Self-pay | Admitting: *Deleted

## 2021-01-16 ENCOUNTER — Other Ambulatory Visit: Payer: Self-pay

## 2021-01-16 VITALS — Ht 67.5 in | Wt 276.0 lb

## 2021-01-16 DIAGNOSIS — Z86010 Personal history of colonic polyps: Secondary | ICD-10-CM

## 2021-01-16 MED ORDER — NA SULFATE-K SULFATE-MG SULF 17.5-3.13-1.6 GM/177ML PO SOLN: 1.0000 | Freq: Once | ORAL | 0 refills | Status: AC

## 2021-01-16 NOTE — Progress Notes (Signed)

## 2021-01-23 ENCOUNTER — Encounter: Payer: Self-pay | Admitting: Gastroenterology

## 2021-01-29 ENCOUNTER — Encounter: Payer: Self-pay | Admitting: Certified Registered Nurse Anesthetist

## 2021-01-30 ENCOUNTER — Encounter: Payer: Self-pay | Admitting: Gastroenterology

## 2021-01-30 ENCOUNTER — Ambulatory Visit (AMBULATORY_SURGERY_CENTER): Payer: Medicare Other | Admitting: Gastroenterology

## 2021-01-30 ENCOUNTER — Other Ambulatory Visit: Payer: Self-pay

## 2021-01-30 VITALS — BP 105/66 | HR 68 | Temp 98.0°F | Resp 15 | Ht 67.5 in | Wt 276.0 lb

## 2021-01-30 DIAGNOSIS — D123 Benign neoplasm of transverse colon: Secondary | ICD-10-CM

## 2021-01-30 DIAGNOSIS — Z86010 Personal history of colonic polyps: Secondary | ICD-10-CM

## 2021-01-30 DIAGNOSIS — D127 Benign neoplasm of rectosigmoid junction: Secondary | ICD-10-CM

## 2021-01-30 DIAGNOSIS — D122 Benign neoplasm of ascending colon: Secondary | ICD-10-CM

## 2021-01-30 MED ORDER — SODIUM CHLORIDE 0.9 % IV SOLN: 500.0000 mL | Freq: Once | INTRAVENOUS | Status: AC

## 2021-01-30 NOTE — Progress Notes (Signed)
VS by DT  Pt's states no medical or surgical changes since previsit or office visit.  

## 2021-01-30 NOTE — Progress Notes (Signed)
Called to room to assist during endoscopic procedure.  Patient ID and intended procedure confirmed with present staff. Received instructions for my participation in the procedure from the performing physician.  

## 2021-01-30 NOTE — Progress Notes (Signed)
[facility] History and Physical   Primary Care Physician:  Katherina Mires, MD   Reason for Procedure:   Colon polyp surveillance  Plan:    Colonoscopy     HPI: Linda Li is a 75 y.o. female  here for colonoscopy surveillance. Multiple polyps removed 04/2019 - advanced adenoma removed in piecemeal. Had recommended a 6-12 month follow up. Patient is overdue for this exam. Patient denies any bowel symptoms at this time. No family history of colon cancer known. Otherwise feels well without any cardiopulmonary symptoms.    Past Medical History:  Diagnosis Date   Arthritis    Breast cancer ([place]) 06/2018   left   Cataract    Diabetes mellitus ([place])    Diverticulosis    Hyperlipidemia    Hypertension    Migraines    Obstructive sleep apnea    pressure is 14   Osteoporosis    Personal history of radiation therapy 2020   Left breast    Rosacea    Sleep apnea    daily c-pap   Spinal stenosis     Past Surgical History:  Procedure Laterality Date   BREAST LUMPECTOMY Left 07/08/2018   BREAST LUMPECTOMY WITH RADIOACTIVE SEED LOCALIZATION Left 07/08/2018   Procedure: LEFT BREAST LUMPECTOMY WITH RADIOACTIVE SEED LOCALIZATION;  Surgeon: Fanny Skates, MD;  Location: [facility];  Service: General;  Laterality: Left;   COLONOSCOPY     KNEE ARTHROSCOPY     right torn meniscus   POLYPECTOMY     SHOULDER ARTHROSCOPY     left torn ligament   TONSILLECTOMY      Prior to Admission medications   Medication Sig Start Date End Date Taking? Authorizing Provider  alendronate (FOSAMAX) 70 MG tablet Take 1 tablet (70 mg total) by mouth once a week. Take with a full glass of water on an empty stomach. 10/01/20  Yes Nicholas Lose, MD  anastrozole (ARIMIDEX) 1 MG tablet Take 1 tablet (1 mg total) by mouth daily. 10/01/20  Yes Nicholas Lose, MD  aspirin EC 81 MG tablet Take 81 mg by mouth daily.   Yes [provider]  Calcium Citrate 200 MG TABS  Take 500 mg by mouth.   Yes [provider]  Cholecalciferol (VITAMIN D3) 1000 UNITS CAPS Take 2,000 Units by mouth.   Yes [provider]  Coenzyme Q10 (COQ10) 100 MG CAPS Take 100 mg by mouth.   Yes [provider]  Cyanocobalamin 5000 MCG SUBL Place under the tongue.   Yes [provider]  escitalopram (LEXAPRO) 10 MG tablet TAKE 1 TABLET BY MOUTH EVERY DAY 05/09/19  Yes Nicholas Lose, MD  glucose blood test strip CHECK BLOOD SUGAR DAILY. 06/16/13  Yes [provider]  Lancets (ONETOUCH DELICA PLUS Q000111Q) [place] by Does not apply route. 07/21/19  Yes [provider]  lisinopril (ZESTRIL) 20 MG tablet Take 20 mg by mouth daily. 10/01/20  Yes [provider]  Magnesium 400 MG CAPS Take by mouth.   Yes [provider]  metFORMIN (GLUCOPHAGE-XR) 500 MG 24 hr tablet Take 500 mg by mouth daily with breakfast.   Yes [provider]  Omega-3 Fatty Acids (FISH OIL) 1000 MG CAPS Take 1 capsule by mouth.   Yes [provider]  polyethylene glycol (MIRALAX / GLYCOLAX) 17 g packet Take 17 g by mouth daily.   Yes [provider]  triamcinolone cream (KENALOG) 0.1 % Apply 1 application topically 2 (two) times daily. 09/17/19  Yes Yu, Amy V, PA-C  TURMERIC PO Take by mouth.   Yes [provider]  UNABLE TO FIND Take 2 capsules by mouth daily. CINSULIN   Yes [provider]  vitamin C (ASCORBIC ACID) 500 MG tablet Take 500 mg by mouth.   Yes [provider]  Wheat Dextrin (BENEFIBER PO) Take by mouth.   Yes [provider]  Azelaic Acid 15 % cream Apply topically. Patient not taking: No sig reported 12/17/11   [provider]  OVER THE COUNTER MEDICATION Take 1 tablet by mouth at bedtime. 5-HTP Plus (Natrol) Patient not taking: No sig reported    [provider]  OVER THE COUNTER MEDICATION Take 1 tablet by mouth at bedtime. Ashwaganda root. Patient not taking: No  sig reported    [provider]  SUMAtriptan (IMITREX) 100 MG tablet 1 po immediately at onset of migraine; may repeat in 2 hrs if HA persists; no more than 2 in 24 hrs 03/19/14   [provider]    Current Outpatient Medications  Medication Sig Dispense Refill   alendronate (FOSAMAX) 70 MG tablet Take 1 tablet (70 mg total) by mouth once a week. Take with a full glass of water on an empty stomach. 12 tablet 3   anastrozole (ARIMIDEX) 1 MG tablet Take 1 tablet (1 mg total) by mouth daily. 90 tablet 2   aspirin EC 81 MG tablet Take 81 mg by mouth daily.     Calcium Citrate 200 MG TABS Take 500 mg by mouth.     Cholecalciferol (VITAMIN D3) 1000 UNITS CAPS Take 2,000 Units by mouth.     Coenzyme Q10 (COQ10) 100 MG CAPS Take 100 mg by mouth.     Cyanocobalamin 5000 MCG SUBL Place under the tongue.     escitalopram (LEXAPRO) 10 MG tablet TAKE 1 TABLET BY MOUTH EVERY DAY 90 tablet 0   glucose blood test strip CHECK BLOOD SUGAR DAILY.     Lancets (ONETOUCH DELICA PLUS Q000111Q) MISC by Does not apply route.     lisinopril (ZESTRIL) 20 MG tablet Take 20 mg by mouth daily.     Magnesium 400 MG CAPS Take by mouth.     metFORMIN (GLUCOPHAGE-XR) 500 MG 24 hr tablet Take 500 mg by mouth daily with breakfast.     Omega-3 Fatty Acids (FISH OIL) 1000 MG CAPS Take 1 capsule by mouth.     polyethylene glycol (MIRALAX / GLYCOLAX) 17 g packet Take 17 g by mouth daily.     triamcinolone cream (KENALOG) 0.1 % Apply 1 application topically 2 (two) times daily. 30 g 0   TURMERIC PO Take by mouth.     UNABLE TO FIND Take 2 capsules by mouth daily. CINSULIN     vitamin C (ASCORBIC ACID) 500 MG tablet Take 500 mg by mouth.     Wheat Dextrin (BENEFIBER PO) Take by mouth.     Azelaic Acid 15 % cream Apply topically. (Patient not taking: No sig reported)     OVER THE COUNTER MEDICATION Take 1 tablet by mouth at bedtime. 5-HTP Plus (Natrol) (Patient not taking: No sig reported)     OVER THE COUNTER  MEDICATION Take 1 tablet by mouth at bedtime. Ashwaganda root. (Patient not taking: No sig reported)     SUMAtriptan (IMITREX) 100 MG tablet 1 po immediately at onset of migraine; may repeat in 2 hrs if HA persists; no more than 2 in 24 hrs     Current Facility-Administered  Medications  Medication Dose Route Frequency Provider Last Rate Last Admin   0.9 %  sodium chloride infusion  500 mL Intravenous Once Mekaela Azizi, Carlota Raspberry, MD       lidocaine (PF) (XYLOCAINE) 1 % injection 0.3 mL  0.3 mL Other Once Magnus Sinning, MD        Allergies as of 01/30/2021 - Review Complete 01/30/2021  Allergen Reaction Noted   Shellfish-derived products  07/03/2014    Family History  Problem Relation Age of Onset   Throat cancer Father    Esophageal cancer Father    Colon cancer Neg Hx    Rectal cancer Neg Hx    Stomach cancer Neg Hx    Colon polyps Neg Hx     Social History   Socioeconomic History   Marital status: Married    Spouse name: Not on file   Number of children: Not on file   Years of education: Not on file   Highest education level: Not on file  Occupational History   Not on file  Tobacco Use   Smoking status: Never   Smokeless tobacco: Never  Vaping Use   Vaping Use: Never used  Substance and Sexual Activity   Alcohol use: No    Alcohol/week: 0.0 standard drinks   Drug use: No   Sexual activity: Not on file  Other Topics Concern   Not on file  Social History Narrative   Not on file   Social Determinants of Health   Financial Resource Strain: Not on file  Food Insecurity: Not on file  Transportation Needs: Not on file  Physical Activity: Not on file  Stress: Not on file  Social Connections: Not on file  Intimate Partner Violence: Not on file    Review of Systems: All other review of systems negative except as mentioned in the HPI.  Physical Exam: Vital signs BP 132/65   Pulse 77   Temp 98 F (36.7 C)   Ht 5' 7.5" (1.715 m)   Wt 276 lb (125.2 kg)    SpO2 98%   BMI 42.59 kg/m   General:   Alert,  Well-developed, well-nourished, pleasant and cooperative in NAD Lungs:  Clear throughout to auscultation.   Heart:  Regular rate and rhythm Abdomen:  Soft, nontender and nondistended.   Neuro/Psych:  Alert and cooperative. Normal mood and affect. A and O x 3  Jolly Mango, MD [facility]

## 2021-01-30 NOTE — Progress Notes (Signed)
1045 BP  172/76, Labetalol given IV, MD update, vss

## 2021-01-30 NOTE — Progress Notes (Signed)
No problems noted in the recovery room. maw 

## 2021-01-30 NOTE — Op Note (Addendum)
[facility] Patient Name: Linda Li Procedure Date: 01/30/2021 10:34 AM MRN: [id number] Endoscopist: Remo Lipps P. Havery Moros , MD Age: 75 Referring MD:  Date of Birth: 12/22/1945 Gender: Female Account #: [id number] Procedure:                Colonoscopy Indications:              High risk colon cancer surveillance: Personal                            history of colonic polyps - last exam 04/2019 -                            mutliple adenomas, largest > 1cm and removed in                            piecemeal (cecum) Medicines:                Monitored Anesthesia Care Procedure:                Pre-Anesthesia Assessment:                           - Prior to the procedure, a History and Physical                            was performed, and patient medications and                            allergies were reviewed. The patient's tolerance of                            previous anesthesia was also reviewed. The risks                            and benefits of the procedure and the sedation                            options and risks were discussed with the patient.                            All questions were answered, and informed consent                            was obtained. Prior Anticoagulants: The patient has                            taken no previous anticoagulant or antiplatelet                            agents. ASA Grade Assessment: III - A patient with                            severe systemic disease. After reviewing the risks  and benefits, the patient was deemed in                            satisfactory condition to undergo the procedure.                           After obtaining informed consent, the colonoscope                            was passed under direct vision. Throughout the                            procedure, the patient's blood pressure, pulse, and                            oxygen saturations were monitored  continuously. The                            CF [id number] was introduced through the anus                            and advanced to the the cecum, identified by                            appendiceal orifice and ileocecal valve. The                            colonoscopy was performed without difficulty. The                            patient tolerated the procedure well. The quality                            of the bowel preparation was adequate. The                            ileocecal valve, appendiceal orifice, and rectum                            were photographed. Scope In: 10:42:21 AM Scope Out: 11:08:40 AM Scope Withdrawal Time: 0 hours 21 minutes 7 seconds  Total Procedure Duration: 0 hours 26 minutes 19 seconds  Findings:                 The perianal and digital rectal examinations were                            normal.                           A 8 mm polyp was found in the ascending colon. The                            polyp was flat and quite subtle. The polyp was  removed with a cold snare. Resection and retrieval                            were complete.                           A 2 to 3 mm polyp was found in the transverse                            colon. The polyp was sessile. The polyp was removed                            with a cold snare. Resection and retrieval were                            complete.                           A 2 to 3 mm polyp was found in the recto-sigmoid                            colon. The polyp was sessile. The polyp was removed                            with a cold snare. Resection and retrieval were                            complete.                           A few small-mouthed diverticula were found in the                            sigmoid colon.                           Internal hemorrhoids were found during retroflexion.                           The exam was otherwise without abnormality.  No                            residual or recurrent polyp in the cecum. Prep was                            okay but several minutes spent lavaging the right                            colon to achieve adequate views. Complications:            No immediate complications. Estimated blood loss:                            Minimal. Estimated Blood Loss:     Estimated blood loss was minimal. Impression:               -  One 8 mm polyp in the ascending colon, removed                            with a cold snare. Resected and retrieved.                           - One 2 to 3 mm polyp in the transverse colon,                            removed with a cold snare. Resected and retrieved.                           - One 2 to 3 mm polyp at the recto-sigmoid colon,                            removed with a cold snare. Resected and retrieved.                           - Diverticulosis in the sigmoid colon.                           - Internal hemorrhoids.                           - The examination was otherwise normal. No residual                            polypoid tissue in the cecum. Recommendation:           - Patient has a contact number available for                            emergencies. The signs and symptoms of potential                            delayed complications were discussed with the                            patient. Return to normal activities tomorrow.                            Written discharge instructions were provided to the                            patient.                           - Resume previous diet.                           - Continue present medications.                           - Await pathology results. Remo Lipps P. Sherelle Castelli, MD 01/30/2021 11:14:19 AM This report has been signed electronically.

## 2021-01-30 NOTE — Progress Notes (Signed)
1057 BP  180/83, Labetalol given IV, MD update, vss

## 2021-01-30 NOTE — Patient Instructions (Addendum)
Handouts were given to your care partner on polyps, diverticulosis, and hemorrhoids. Your blood sugar was 160 in the recovery room. You may resume your current medications today. Await biopsy results.  May take 1-3 weeks to receive pathology results. Please call if any questions or concerns.      YOU HAD AN ENDOSCOPIC PROCEDURE TODAY AT [facility]:   Refer to the procedure report that was given to you for any specific questions about what was found during the examination.  If the procedure report does not answer your questions, please call your gastroenterologist to clarify.  If you requested that your care partner not be given the details of your procedure findings, then the procedure report has been included in a sealed envelope for you to review at your convenience later.  YOU SHOULD EXPECT: Some feelings of bloating in the abdomen. Passage of more gas than usual.  Walking can help get rid of the air that was put into your GI tract during the procedure and reduce the bloating. If you had a lower endoscopy (such as a colonoscopy or flexible sigmoidoscopy) you may notice spotting of blood in your stool or on the toilet paper. If you underwent a bowel prep for your procedure, you may not have a normal bowel movement for a few days.  Please Note:  You might notice some irritation and congestion in your nose or some drainage.  This is from the oxygen used during your procedure.  There is no need for concern and it should clear up in a day or so.  SYMPTOMS TO REPORT IMMEDIATELY:  Following lower endoscopy (colonoscopy or flexible sigmoidoscopy):  Excessive amounts of blood in the stool  Significant tenderness or worsening of abdominal pains  Swelling of the abdomen that is new, acute  Fever of 100F or higher   For urgent or emergent issues, a gastroenterologist can be reached at any hour by calling [phone]. Do not use MyChart messaging for urgent concerns.    DIET:   We do recommend a small meal at first, but then you may proceed to your regular diet.  Drink plenty of fluids but you should avoid alcoholic beverages for 24 hours.  ACTIVITY:  You should plan to take it easy for the rest of today and you should NOT DRIVE or use heavy machinery until tomorrow (because of the sedation medicines used during the test).    FOLLOW UP: Our staff will call the number listed on your records 48-72 hours following your procedure to check on you and address any questions or concerns that you may have regarding the information given to you following your procedure. If we do not reach you, we will leave a message.  We will attempt to reach you two times.  During this call, we will ask if you have developed any symptoms of COVID 19. If you develop any symptoms (ie: fever, flu-like symptoms, shortness of breath, cough etc.) before then, please call [phone].  If you test positive for Covid 19 in the 2 weeks post procedure, please call and report this information to [place].    If any biopsies were taken you will be contacted by phone or by letter within the next 1-3 weeks.  Please call us at [phone] if you have not heard about the biopsies in 3 weeks.    SIGNATURES/CONFIDENTIALITY: You and/or your care partner have signed paperwork which will be entered into your electronic medical record.  These signatures attest to the  fact that that the information above on your After Visit Summary has been reviewed and is understood.  Full responsibility of the confidentiality of this discharge information lies with you and/or your care-partner.

## 2021-01-30 NOTE — Progress Notes (Signed)
Report given to PACU, vss 

## 2021-02-03 ENCOUNTER — Telehealth: Payer: Self-pay | Admitting: *Deleted

## 2021-02-03 NOTE — Telephone Encounter (Signed)
Patient returned call. States she is doing fine.

## 2021-02-03 NOTE — Telephone Encounter (Signed)
  Follow up Call-  Call back number 01/30/2021 05/17/2019  Post procedure Call Back phone  # [phone] Elenore Rota  Permission to leave phone message Yes Yes  Some recent data might be hidden     Patient questions: Patient can't hear me, and is having troubling attaching her hearing aids to the phone.  Will call back later.

## 2021-05-13 ENCOUNTER — Telehealth: Payer: Self-pay | Admitting: *Deleted

## 2021-05-13 NOTE — Telephone Encounter (Signed)
Patient called - states she has mammograms in January or February of every year since her cancer diagnosis in 2020. She asked if it had been ordered yet since she sees Dr. Lindi Adie for her one year f/u in February. Message sent to MD and support staff

## 2021-05-16 ENCOUNTER — Other Ambulatory Visit: Payer: Self-pay

## 2021-05-16 DIAGNOSIS — Z170 Estrogen receptor positive status [ER+]: Secondary | ICD-10-CM

## 2021-06-23 ENCOUNTER — Ambulatory Visit
Admission: RE | Admit: 2021-06-23 | Discharge: 2021-06-23 | Disposition: A | Discharge status: Home / Self Care | Payer: Medicare Other | Source: Ambulatory Visit | Attending: Adult Health | Admitting: Adult Health

## 2021-06-23 DIAGNOSIS — C50412 Malignant neoplasm of upper-outer quadrant of left female breast: Secondary | ICD-10-CM

## 2021-06-23 DIAGNOSIS — Z170 Estrogen receptor positive status [ER+]: Secondary | ICD-10-CM

## 2021-07-08 NOTE — Assessment & Plan Note (Signed)
07/08/2018: Left lumpectomy: Grade 2 IDC, 1.3 cm, margins negative, negative for lymphovascular or perineural invasion, ER 100%, PR 90%, HER-2 -1+, Ki-67 3%, T1CNX stage Ia Oncotype DX recurrence score 4, risk of distant recurrence: 3%, low risk Adjuvant radiation therapy started 11/04/2018 Adjuvant antiestrogen therapy started April 2020  Anastrozole toxicities: Severe hot flashes:Continues to be the same.  Breast cancer surveillance: 1.Breast exam 07/09/2021: Benign 2.Mammogram 06/23/21: Benign breast density category B  Severe arthritis: Patient is seeing a pain physician.  Return to clinic in 1 year for follow-up

## 2021-07-08 NOTE — Progress Notes (Signed)
Patient Care Team: Katherina Mires, MD as PCP - General (Family Medicine) Fanny Skates, MD as Consulting Physician (General Surgery) Nicholas Lose, MD as Consulting Physician (Hematology and Oncology) Gery Pray, MD as Consulting Physician (Radiation Oncology)  DIAGNOSIS:    ICD-10-CM   1. Malignant neoplasm of upper-outer quadrant of left breast in female, estrogen receptor positive ([place])  C50.412    Z17.0       SUMMARY OF ONCOLOGIC HISTORY: Oncology History  Malignant neoplasm of upper-outer quadrant of left breast in female, estrogen receptor positive ([place])  06/13/2018 Initial Diagnosis   Screening detected left breast asymmetry with distortion, ultrasound 8 mm at 12 o'clock position, axilla negative, ultrasound-guided biopsy revealed grade 1 IDC with DCIS ER 100%, PR 100%, HER-2 -1+ by IHC, Ki-67 3%, T1BN0 stage Ia clinical stage   07/08/2018 Surgery   Left lumpectomy: Grade 2 IDC, 1.3 cm, margins negative, negative for lymphovascular or perineural invasion, ER 100%, PR 90%, HER-2 -1+, Ki-67 3%, T1CNX stage Ia    07/20/2018 Cancer Staging   Staging form: Breast, AJCC 8th Edition - Pathologic: Stage IA (pT1c, pN0, cM0, G2, ER+, PR+, HER2-) - Signed by Gardenia Phlegm, NP on 07/20/2018    07/25/2018 Oncotype testing   Oncotype DX recurrence score 4, distant recurrence risk at 9 years: 3%, low risk   08/23/2018 -  Anti-estrogen oral therapy   Anastrozole 1 mg daily   11/01/2018 - 11/30/2018 Radiation Therapy   Adjuvant radiation therapy: 1. Left Breast / 40.05 Gy in 15 fractions 2. Boost / 10 Gy in 5 fractions     CHIEF COMPLIANT: Follow-up of left breast cancer on anastrozole   INTERVAL HISTORY: Linda Li is a 76 y.o. with above-mentioned history of left breast cancer who underwent a lumpectomy, radiation, and is currently on anastrozole. Mammogram on 06/23/2021 showed no evidence of malignancy bilaterally. She presents to the clinic today for annual  follow-up.  Denies any lumps or nodules.  She has experienced hot flashes from anastrozole therapy.  ALLERGIES:  is allergic to shellfish-derived products.  MEDICATIONS:  Current Outpatient Medications  Medication Sig Dispense Refill   alendronate (FOSAMAX) 70 MG tablet Take 1 tablet (70 mg total) by mouth once a week. Take with a full glass of water on an empty stomach. 12 tablet 3   anastrozole (ARIMIDEX) 1 MG tablet Take 1 tablet (1 mg total) by mouth daily. 90 tablet 2   aspirin EC 81 MG tablet Take 81 mg by mouth daily.     Azelaic Acid 15 % cream Apply topically. (Patient not taking: No sig reported)     Calcium Citrate 200 MG TABS Take 500 mg by mouth.     Cholecalciferol (VITAMIN D3) 1000 UNITS CAPS Take 2,000 Units by mouth.     Coenzyme Q10 (COQ10) 100 MG CAPS Take 100 mg by mouth.     Cyanocobalamin 5000 MCG SUBL Place under the tongue.     escitalopram (LEXAPRO) 10 MG tablet TAKE 1 TABLET BY MOUTH EVERY DAY 90 tablet 0   glucose blood test strip CHECK BLOOD SUGAR DAILY.     Lancets (ONETOUCH DELICA PLUS [id number]) MISC by Does not apply route.     lisinopril (ZESTRIL) 20 MG tablet Take 20 mg by mouth daily.     Magnesium 400 MG CAPS Take by mouth.     metFORMIN (GLUCOPHAGE-XR) 500 MG 24 hr tablet Take 500 mg by mouth daily with breakfast.     Omega-3 Fatty Acids (FISH OIL)  1000 MG CAPS Take 1 capsule by mouth.     OVER THE COUNTER MEDICATION Take 1 tablet by mouth at bedtime. 5-HTP Plus (Natrol) (Patient not taking: No sig reported)     OVER THE COUNTER MEDICATION Take 1 tablet by mouth at bedtime. Ashwaganda root. (Patient not taking: No sig reported)     polyethylene glycol (MIRALAX / GLYCOLAX) 17 g packet Take 17 g by mouth daily.     SUMAtriptan (IMITREX) 100 MG tablet 1 po immediately at onset of migraine; may repeat in 2 hrs if HA persists; no more than 2 in 24 hrs     triamcinolone cream (KENALOG) 0.1 % Apply 1 application topically 2 (two) times daily. 30 g 0   TURMERIC  PO Take by mouth.     UNABLE TO FIND Take 2 capsules by mouth daily. CINSULIN     vitamin C (ASCORBIC ACID) 500 MG tablet Take 500 mg by mouth.     Wheat Dextrin (BENEFIBER PO) Take by mouth.     Current Facility-Administered Medications  Medication Dose Route Frequency Provider Last Rate Last Admin   0.9 %  sodium chloride infusion  500 mL Intravenous Once Armbruster, Carlota Raspberry, MD       lidocaine (PF) (XYLOCAINE) 1 % injection 0.3 mL  0.3 mL Other Once Magnus Sinning, MD        PHYSICAL EXAMINATION: ECOG PERFORMANCE STATUS: 1 - Symptomatic but completely ambulatory  Vitals:   07/09/21 0930  BP: (!) 162/68  Pulse: 83  Resp: 19  Temp: 97.8 F (36.6 C)  SpO2: 96%   Filed Weights   07/09/21 0930  Weight: 279 lb 3.2 oz (126.6 kg)    BREAST: No palpable masses or nodules in either right or left breasts. No palpable axillary supraclavicular or infraclavicular adenopathy no breast tenderness or nipple discharge. (exam performed in the presence of a chaperone)  LABORATORY DATA:  I have reviewed the data as listed CMP Latest Ref Rng & Units 06/15/2018  Glucose 70 - 99 mg/dL 132(H)  BUN 8 - 23 mg/dL 19  Creatinine 0.44 - 1.00 mg/dL 0.75  Sodium 135 - 145 mmol/L 140  Potassium 3.5 - 5.1 mmol/L 4.4  Chloride 98 - 111 mmol/L 104  CO2 22 - 32 mmol/L 24  Calcium 8.9 - 10.3 mg/dL 10.0  Total Protein 6.5 - 8.1 g/dL 7.5  Total Bilirubin 0.3 - 1.2 mg/dL 0.3  Alkaline Phos 38 - 126 U/L 97  AST 15 - 41 U/L 14(L)  ALT 0 - 44 U/L 19    Lab Results  Component Value Date   WBC 9.4 06/15/2018   HGB 14.9 06/15/2018   HCT 43.2 06/15/2018   MCV 88.0 06/15/2018   PLT 251 06/15/2018   NEUTROABS 4.9 06/15/2018    ASSESSMENT & PLAN:  Malignant neoplasm of upper-outer quadrant of left breast in female, estrogen receptor positive ([place]) 07/08/2018: Left lumpectomy: Grade 2 IDC, 1.3 cm, margins negative, negative for lymphovascular or perineural invasion, ER 100%, PR 90%, HER-2 -1+, Ki-67 3%,  T1CNX stage Ia Oncotype DX recurrence score 4, risk of distant recurrence: 3%, low risk Adjuvant radiation therapy started 11/04/2018 Adjuvant antiestrogen therapy started April 2020   Anastrozole toxicities: Severe hot flashes: Continues to be the same. Vaginal dryness: I discussed with her about using coconut oil or Replens   Breast cancer surveillance: 1.  Breast exam 07/09/2021: Benign 2.  Mammogram 06/23/21: Benign breast density category B   Severe arthritis: Patient is seeing a pain  physician. Severe stress related to her adult son who has psychiatric issues and is in the hospital at this time.  Return to clinic in 1 year for follow-up    No orders of the defined types were placed in this encounter.  The patient has a good understanding of the overall plan. she agrees with it. she will call with any problems that may develop before the next visit here.  Total time spent: 20 mins including face to face time and time spent for planning, charting and coordination of care  Rulon Eisenmenger, MD, MPH 07/09/2021  I, Thana Ates, am acting as scribe for Dr. Nicholas Lose.  I have reviewed the above documentation for accuracy and completeness, and I agree with the above.

## 2021-07-09 ENCOUNTER — Inpatient Hospital Stay: Payer: Medicare Other | Attending: Hematology and Oncology | Admitting: Hematology and Oncology

## 2021-07-09 ENCOUNTER — Other Ambulatory Visit: Payer: Self-pay

## 2021-07-09 DIAGNOSIS — Z79899 Other long term (current) drug therapy: Secondary | ICD-10-CM | POA: Diagnosis not present

## 2021-07-09 DIAGNOSIS — Z170 Estrogen receptor positive status [ER+]: Secondary | ICD-10-CM | POA: Insufficient documentation

## 2021-07-09 DIAGNOSIS — Z79811 Long term (current) use of aromatase inhibitors: Secondary | ICD-10-CM | POA: Diagnosis not present

## 2021-07-09 DIAGNOSIS — C50412 Malignant neoplasm of upper-outer quadrant of left female breast: Secondary | ICD-10-CM | POA: Insufficient documentation

## 2021-07-09 DIAGNOSIS — R232 Flushing: Secondary | ICD-10-CM | POA: Diagnosis not present

## 2021-07-09 MED ORDER — LISINOPRIL 30 MG PO TABS: 30.0000 mg | ORAL_TABLET | Freq: Every day | ORAL | Status: AC

## 2021-07-09 MED ORDER — ANASTROZOLE 1 MG PO TABS: 1.0000 mg | ORAL_TABLET | Freq: Every day | ORAL | 3 refills | Status: DC

## 2021-08-31 ENCOUNTER — Other Ambulatory Visit: Payer: Self-pay | Admitting: Hematology and Oncology

## 2021-12-24 ENCOUNTER — Telehealth: Payer: Self-pay | Admitting: *Deleted

## 2021-12-24 NOTE — Telephone Encounter (Signed)
Received call from pt with complaint of ongoing hot flashes while on Anastrozole despite starting Gabapentin 300 mg p.o daily at bedtime with PCP.  Per MD pt needing to increase Gabapentin frequency to BID for 1 month.  If hot flashes do not decrease in frequency, pt may increase to TID. Pt educated and stats she will alert her PCP of plan to help with medication management.  Pt educated to contact office if symptoms do not improve.

## 2022-01-23 ENCOUNTER — Ambulatory Visit
Admission: EM | Admit: 2022-01-23 | Discharge: 2022-01-23 | Disposition: A | Discharge status: Home / Self Care | Payer: Medicare Other | Attending: Physician Assistant | Admitting: Physician Assistant

## 2022-01-23 DIAGNOSIS — R21 Rash and other nonspecific skin eruption: Secondary | ICD-10-CM | POA: Diagnosis not present

## 2022-01-23 MED ORDER — DOXYCYCLINE HYCLATE 100 MG PO CAPS: 100.0000 mg | ORAL_CAPSULE | Freq: Two times a day (BID) | ORAL | 0 refills | Status: DC

## 2022-01-23 MED ORDER — PREDNISONE 20 MG PO TABS: 20.0000 mg | ORAL_TABLET | Freq: Every day | ORAL | 0 refills | Status: AC

## 2022-01-23 NOTE — ED Provider Notes (Signed)
[facility]    CSN: [id number] Arrival date & time: 01/23/22  1856      History   Chief Complaint Chief Complaint  Patient presents with   Rash    HPI Linda Li is a 76 y.o. female.   Patient here today for evaluation of rash to her right wrist and left upper arm that has been present for the last 2 days. She thinks rash to wrist may be from pulling crab grass as she has had reaction to same in the past. She denies any shortness of breath or difficulty breathing. She has been using topical steroid ointment and benadryl without resolution. She has noticed more redness and swelling to her left arm. She has not had fever.   The history is provided by the patient.    Past Medical History:  Diagnosis Date   Arthritis    Breast cancer ([place]) 06/2018   left   Cataract    Diabetes mellitus ([place])    Diverticulosis    Hyperlipidemia    Hypertension    Migraines    Obstructive sleep apnea    pressure is 14   Osteoporosis    Personal history of radiation therapy 2020   Left breast    Rosacea    Sleep apnea    daily c-pap   Spinal stenosis     Patient Active Problem List   Diagnosis Date Noted   Spinal stenosis of lumbar region with neurogenic claudication 07/19/2019   Spondylolisthesis of lumbar region 07/19/2019   Unilateral primary osteoarthritis, right knee 07/19/2019   Low back pain 06/27/2019   Obesity 06/27/2019   Closed fracture of left distal radius 06/01/2019   Thumb pain, left 05/02/2019   Malignant neoplasm of upper-outer quadrant of left breast in female, estrogen receptor positive ([place]) 06/13/2018    Past Surgical History:  Procedure Laterality Date   BREAST LUMPECTOMY Left 07/08/2018   BREAST LUMPECTOMY WITH RADIOACTIVE SEED LOCALIZATION Left 07/08/2018   Procedure: LEFT BREAST LUMPECTOMY WITH RADIOACTIVE SEED LOCALIZATION;  Surgeon: Fanny Skates, MD;  Location: [facility];  Service: General;  Laterality: Left;    COLONOSCOPY     KNEE ARTHROSCOPY     right torn meniscus   POLYPECTOMY     SHOULDER ARTHROSCOPY     left torn ligament   TONSILLECTOMY      OB History   No obstetric history on file.      Home Medications    Prior to Admission medications   Medication Sig Start Date End Date Taking? Authorizing Provider  doxycycline (VIBRAMYCIN) 100 MG capsule Take 1 capsule (100 mg total) by mouth 2 (two) times daily. 01/23/22  Yes Francene Finders, PA-C  predniSONE (DELTASONE) 20 MG tablet Take 1 tablet (20 mg total) by mouth daily with breakfast for 5 days. 01/23/22 01/28/22 Yes Francene Finders, PA-C  alendronate (FOSAMAX) 70 MG tablet TAKE 1 TABLET BY MOUTH  WEEKLY WITH 8 OZ OF PLAIN  WATER 30 MINUTES BEFORE  FIRST FOOD, DRINK OR MEDS.  STAY UPRIGHT FOR 30 MINS 09/01/21   Nicholas Lose, MD  anastrozole (ARIMIDEX) 1 MG tablet Take 1 tablet (1 mg total) by mouth daily. 07/09/21   Nicholas Lose, MD  aspirin EC 81 MG tablet Take 81 mg by mouth daily.    [provider]  Azelaic Acid 15 % cream Apply topically. Patient not taking: No sig reported 12/17/11   [provider]  Calcium Citrate 200 MG TABS Take 500  mg by mouth.    [provider]  Cholecalciferol (VITAMIN D3) 1000 UNITS CAPS Take 2,000 Units by mouth.    [provider]  Coenzyme Q10 (COQ10) 100 MG CAPS Take 100 mg by mouth.    [provider]  Cyanocobalamin 5000 MCG SUBL Place under the tongue.    [provider]  escitalopram (LEXAPRO) 10 MG tablet TAKE 1 TABLET BY MOUTH EVERY DAY 05/09/19   Nicholas Lose, MD  gabapentin (NEURONTIN) 300 MG capsule Take 300 mg by mouth at bedtime.    [provider]  glucose blood test strip CHECK BLOOD SUGAR DAILY. 06/16/13   [provider]  Lancets (ONETOUCH DELICA PLUS [id number]) [place] by Does not apply route. 07/21/19   [provider]  lisinopril (ZESTRIL) 30 MG tablet Take 1 tablet (30 mg total) by mouth daily. 07/09/21    Nicholas Lose, MD  Magnesium 400 MG CAPS Take by mouth.    [provider]  metFORMIN (GLUCOPHAGE-XR) 500 MG 24 hr tablet Take 500 mg by mouth daily with breakfast.    [provider]  Omega-3 Fatty Acids (FISH OIL) 1000 MG CAPS Take 1 capsule by mouth.    [provider]  OVER THE COUNTER MEDICATION Take 1 tablet by mouth at bedtime. Ashwaganda root. Patient not taking: No sig reported    [provider]  polyethylene glycol (MIRALAX / GLYCOLAX) 17 g packet Take 17 g by mouth daily.    [provider]  SUMAtriptan (IMITREX) 100 MG tablet 1 po immediately at onset of migraine; may repeat in 2 hrs if HA persists; no more than 2 in 24 hrs 03/19/14   [provider]  triamcinolone cream (KENALOG) 0.1 % Apply 1 application topically 2 (two) times daily. 09/17/19   Tasia Catchings, Amy V, PA-C  TURMERIC PO Take by mouth.    [provider]  UNABLE TO FIND Take 2 capsules by mouth daily. [place]    [provider]  vitamin C (ASCORBIC ACID) 500 MG tablet Take 500 mg by mouth.    [provider]  Wheat Dextrin (BENEFIBER PO) Take by mouth.    [provider]    Family History Family History  Problem Relation Age of Onset   Throat cancer Father    Esophageal cancer Father    Colon cancer Neg Hx    Rectal cancer Neg Hx    Stomach cancer Neg Hx    Colon polyps Neg Hx     Social History Social History   Tobacco Use   Smoking status: Never   Smokeless tobacco: Never  Vaping Use   Vaping Use: Never used  Substance Use Topics   Alcohol use: No    Alcohol/week: 0.0 standard drinks of alcohol   Drug use: No     Allergies   Shellfish-derived products   Review of Systems Review of Systems  Constitutional:  Negative for chills and fever.  Eyes:  Negative for discharge and redness.  Respiratory:  Negative for shortness of breath.   Gastrointestinal:  Negative for nausea and vomiting.  Skin:  Positive for color  change and rash.     Physical Exam Triage Vital Signs ED Triage Vitals  Enc Vitals Group     BP      Pulse      Resp      Temp      Temp src      SpO2      Weight  Height      Head Circumference      Peak Flow      Pain Score      Pain Loc      Pain Edu?      Excl. in [place]?    No data found.  Updated Vital Signs BP (!) 142/81   Pulse 88   Temp 98.2 F (36.8 C)   Resp 20   SpO2 91%      Physical Exam Vitals and nursing note reviewed.  Constitutional:      General: She is not in acute distress.    Appearance: Normal appearance. She is not ill-appearing.  HENT:     Head: Normocephalic and atraumatic.  Eyes:     Conjunctiva/sclera: Conjunctivae normal.  Cardiovascular:     Rate and Rhythm: Normal rate.  Pulmonary:     Effort: Pulmonary effort is normal. No respiratory distress.  Skin:    Findings: Rash (erythematous papular rash in cluster to right vental wrist, smaller cluster of papules to left upper arm with spreading surrounding erythema) present.  Neurological:     Mental Status: She is alert.  Psychiatric:        Mood and Affect: Mood normal.        Behavior: Behavior normal.        Thought Content: Thought content normal.      UC Treatments / Results  Labs (all labs ordered are listed, but only abnormal results are displayed) Labs Reviewed - No data to display  EKG   Radiology No results found.  Procedures Procedures (including critical care time)  Medications Ordered in UC Medications - No data to display  Initial Impression / Assessment and Plan / UC Course  I have reviewed the triage vital signs and the nursing notes.  Pertinent labs & imaging results that were available during my care of the patient were reviewed by me and considered in my medical decision making (see chart for details).    Will treat with low dose steroid given known diabetes that is not well controlled. Antibiotic prescribed to cover secondary infection  given appearance of left arm. Encouraged follow up with any further concerns or worsening or persistent symptoms.   Final Clinical Impressions(s) / UC Diagnoses   Final diagnoses:  Rash and nonspecific skin eruption   Discharge Instructions   None    ED Prescriptions     Medication Sig Dispense Auth. Provider   predniSONE (DELTASONE) 20 MG tablet Take 1 tablet (20 mg total) by mouth daily with breakfast for 5 days. 5 tablet Ewell Poe F, PA-C   doxycycline (VIBRAMYCIN) 100 MG capsule Take 1 capsule (100 mg total) by mouth 2 (two) times daily. 20 capsule Francene Finders, PA-C      PDMP not reviewed this encounter.   Francene Finders, PA-C 01/23/22 1931

## 2022-01-23 NOTE — ED Triage Notes (Signed)
Pt presents to uc with co of rash on right wrist starting 2 days ago. Rash is red and itchy in nature, pt reports taking benadryl for the rash and applying hydrocortisone cream.   Pt also has reports redness and swelling to Left bicept.

## 2022-04-14 ENCOUNTER — Other Ambulatory Visit: Payer: Self-pay | Admitting: Hematology and Oncology

## 2022-04-14 DIAGNOSIS — Z1231 Encounter for screening mammogram for malignant neoplasm of breast: Secondary | ICD-10-CM

## 2022-06-24 ENCOUNTER — Other Ambulatory Visit: Payer: Self-pay | Admitting: Hematology and Oncology

## 2022-06-25 ENCOUNTER — Ambulatory Visit
Admission: RE | Admit: 2022-06-25 | Discharge: 2022-06-25 | Disposition: A | Discharge status: Home / Self Care | Payer: Medicare Other | Source: Ambulatory Visit | Attending: Hematology and Oncology | Admitting: Hematology and Oncology

## 2022-06-25 DIAGNOSIS — Z1231 Encounter for screening mammogram for malignant neoplasm of breast: Secondary | ICD-10-CM

## 2022-07-03 ENCOUNTER — Telehealth: Payer: Self-pay | Admitting: Hematology and Oncology

## 2022-07-03 NOTE — Telephone Encounter (Signed)
Scheduled appointment per provider PAL. Left voicemail.

## 2022-07-09 ENCOUNTER — Ambulatory Visit: Payer: Medicare Other | Admitting: Hematology and Oncology

## 2022-07-09 NOTE — Progress Notes (Signed)
Patient Care Team: Katherina Mires, MD as PCP - General (Family Medicine) Fanny Skates, MD as Consulting Physician (General Surgery) Nicholas Lose, MD as Consulting Physician (Hematology and Oncology) Gery Pray, MD as Consulting Physician (Radiation Oncology)  DIAGNOSIS: No diagnosis found.  SUMMARY OF ONCOLOGIC HISTORY: Oncology History  Malignant neoplasm of upper-outer quadrant of left breast in female, estrogen receptor positive ([place])  06/13/2018 Initial Diagnosis   Screening detected left breast asymmetry with distortion, ultrasound 8 mm at 12 o'clock position, axilla negative, ultrasound-guided biopsy revealed grade 1 IDC with DCIS ER 100%, PR 100%, HER-2 -1+ by IHC, Ki-67 3%, T1BN0 stage Ia clinical stage   07/08/2018 Surgery   Left lumpectomy: Grade 2 IDC, 1.3 cm, margins negative, negative for lymphovascular or perineural invasion, ER 100%, PR 90%, HER-2 -1+, Ki-67 3%, T1CNX stage Ia    07/20/2018 Cancer Staging   Staging form: Breast, AJCC 8th Edition - Pathologic: Stage IA (pT1c, pN0, cM0, G2, ER+, PR+, HER2-) - Signed by Gardenia Phlegm, NP on 07/20/2018   07/25/2018 Oncotype testing   Oncotype DX recurrence score 4, distant recurrence risk at 9 years: 3%, low risk   08/23/2018 -  Anti-estrogen oral therapy   Anastrozole 1 mg daily   11/01/2018 - 11/30/2018 Radiation Therapy   Adjuvant radiation therapy: 1. Left Breast / 40.05 Gy in 15 fractions 2. Boost / 10 Gy in 5 fractions     CHIEF COMPLIANT: Follow-up of left breast cancer on anastrozole   INTERVAL HISTORY: Linda Li is a   77 y.o. with above-mentioned history of left breast cancer who underwent a lumpectomy, radiation, and is currently on anastrozole. Mammogram on 06/23/2021 showed no evidence of malignancy bilaterally. She presents to the clinic today for annual follow-up.   ALLERGIES:  is allergic to shellfish-derived products.  MEDICATIONS:  Current Outpatient Medications   Medication Sig Dispense Refill   alendronate (FOSAMAX) 70 MG tablet TAKE 1 TABLET BY MOUTH  WEEKLY WITH 8 OZ OF PLAIN  WATER 30 MINUTES BEFORE  FIRST FOOD, DRINK OR MEDS.  STAY UPRIGHT FOR 30 MINS 12 tablet 3   anastrozole (ARIMIDEX) 1 MG tablet TAKE 1 TABLET BY MOUTH DAILY 90 tablet 3   aspirin EC 81 MG tablet Take 81 mg by mouth daily.     Azelaic Acid 15 % cream Apply topically. (Patient not taking: No sig reported)     Calcium Citrate 200 MG TABS Take 500 mg by mouth.     Cholecalciferol (VITAMIN D3) 1000 UNITS CAPS Take 2,000 Units by mouth.     Coenzyme Q10 (COQ10) 100 MG CAPS Take 100 mg by mouth.     Cyanocobalamin 5000 MCG SUBL Place under the tongue.     doxycycline (VIBRAMYCIN) 100 MG capsule Take 1 capsule (100 mg total) by mouth 2 (two) times daily. 20 capsule 0   escitalopram (LEXAPRO) 10 MG tablet TAKE 1 TABLET BY MOUTH EVERY DAY 90 tablet 0   gabapentin (NEURONTIN) 300 MG capsule Take 300 mg by mouth at bedtime.     glucose blood test strip CHECK BLOOD SUGAR DAILY.     Lancets (ONETOUCH DELICA PLUS [id number]) MISC by Does not apply route.     lisinopril (ZESTRIL) 30 MG tablet Take 1 tablet (30 mg total) by mouth daily.     Magnesium 400 MG CAPS Take by mouth.     metFORMIN (GLUCOPHAGE-XR) 500 MG 24 hr tablet Take 500 mg by mouth daily with breakfast.     Omega-3 Fatty  Acids (FISH OIL) 1000 MG CAPS Take 1 capsule by mouth.     OVER THE COUNTER MEDICATION Take 1 tablet by mouth at bedtime. Ashwaganda root. (Patient not taking: No sig reported)     polyethylene glycol (MIRALAX / GLYCOLAX) 17 g packet Take 17 g by mouth daily.     SUMAtriptan (IMITREX) 100 MG tablet 1 po immediately at onset of migraine; may repeat in 2 hrs if HA persists; no more than 2 in 24 hrs     triamcinolone cream (KENALOG) 0.1 % Apply 1 application topically 2 (two) times daily. 30 g 0   TURMERIC PO Take by mouth.     UNABLE TO FIND Take 2 capsules by mouth daily. CINSULIN     vitamin C (ASCORBIC ACID)  500 MG tablet Take 500 mg by mouth.     Wheat Dextrin (BENEFIBER PO) Take by mouth.     Current Facility-Administered Medications  Medication Dose Route Frequency Provider Last Rate Last Admin   0.9 %  sodium chloride infusion  500 mL Intravenous Once Armbruster, Carlota Raspberry, MD       lidocaine (PF) (XYLOCAINE) 1 % injection 0.3 mL  0.3 mL Other Once Magnus Sinning, MD        PHYSICAL EXAMINATION: ECOG PERFORMANCE STATUS: {CHL ONC ECOG [id number]  There were no vitals filed for this visit. There were no vitals filed for this visit.  BREAST:*** No palpable masses or nodules in either right or left breasts. No palpable axillary supraclavicular or infraclavicular adenopathy no breast tenderness or nipple discharge. (exam performed in the presence of a chaperone)  LABORATORY DATA:  I have reviewed the data as listed    Latest Ref Rng & Units 06/15/2018   12:20 PM  CMP  Glucose 70 - 99 mg/dL 132   BUN 8 - 23 mg/dL 19   Creatinine 0.44 - 1.00 mg/dL 0.75   Sodium 135 - 145 mmol/L 140   Potassium 3.5 - 5.1 mmol/L 4.4   Chloride 98 - 111 mmol/L 104   CO2 22 - 32 mmol/L 24   Calcium 8.9 - 10.3 mg/dL 10.0   Total Protein 6.5 - 8.1 g/dL 7.5   Total Bilirubin 0.3 - 1.2 mg/dL 0.3   Alkaline Phos 38 - 126 U/L 97   AST 15 - 41 U/L 14   ALT 0 - 44 U/L 19     Lab Results  Component Value Date   WBC 9.4 06/15/2018   HGB 14.9 06/15/2018   HCT 43.2 06/15/2018   MCV 88.0 06/15/2018   PLT 251 06/15/2018   NEUTROABS 4.9 06/15/2018    ASSESSMENT & PLAN:  No problem-specific Assessment & Plan notes found for this encounter.    No orders of the defined types were placed in this encounter.  The patient has a good understanding of the overall plan. she agrees with it. she will call with any problems that may develop before the next visit here. Total time spent: 30 mins including face to face time and time spent for planning, charting and co-ordination of care   Suzzette Righter,  [place] 07/09/22    I Gardiner Coins am acting as a Education administrator for [employer]  ***

## 2022-07-13 ENCOUNTER — Other Ambulatory Visit: Payer: Self-pay | Admitting: Hematology and Oncology

## 2022-07-14 ENCOUNTER — Inpatient Hospital Stay: Payer: Medicare Other | Attending: Hematology and Oncology | Admitting: Hematology and Oncology

## 2022-07-14 VITALS — BP 156/81 | HR 77 | Temp 98.0°F | Resp 20 | Wt 276.5 lb

## 2022-07-14 DIAGNOSIS — C50412 Malignant neoplasm of upper-outer quadrant of left female breast: Secondary | ICD-10-CM | POA: Diagnosis present

## 2022-07-14 DIAGNOSIS — Z780 Asymptomatic menopausal state: Secondary | ICD-10-CM | POA: Diagnosis not present

## 2022-07-14 DIAGNOSIS — Z170 Estrogen receptor positive status [ER+]: Secondary | ICD-10-CM

## 2022-07-14 DIAGNOSIS — Z79811 Long term (current) use of aromatase inhibitors: Secondary | ICD-10-CM | POA: Insufficient documentation

## 2022-07-14 NOTE — Assessment & Plan Note (Signed)
07/08/2018: Left lumpectomy: Grade 2 IDC, 1.3 cm, margins negative, negative for lymphovascular or perineural invasion, ER 100%, PR 90%, HER-2 -1+, Ki-67 3%, T1CNX stage Ia Oncotype DX recurrence score 4, risk of distant recurrence: 3%, low risk Adjuvant radiation therapy started 11/04/2018 Adjuvant antiestrogen therapy started April 2020   Anastrozole toxicities: Severe hot flashes: Continues to be the same. Vaginal dryness: I discussed with her about using coconut oil or Replens   Breast cancer surveillance: 1.  Breast exam 07/14/2022: Benign 2.  Mammogram 06/26/2022: Benign breast density category B   Severe arthritis: Patient is seeing a pain physician. Severe stress related to her adult son who has psychiatric issues and is in the hospital at this time.   Return to clinic in 1 year for follow-up

## 2022-07-14 NOTE — Telephone Encounter (Signed)
You will be seeing pt today, please refill

## 2022-08-12 ENCOUNTER — Telehealth: Payer: Self-pay | Admitting: *Deleted

## 2022-08-12 ENCOUNTER — Ambulatory Visit (HOSPITAL_BASED_OUTPATIENT_CLINIC_OR_DEPARTMENT_OTHER)
Admission: RE | Admit: 2022-08-12 | Discharge: 2022-08-12 | Disposition: A | Discharge status: Home / Self Care | Payer: Medicare Other | Source: Ambulatory Visit | Attending: Hematology and Oncology | Admitting: Hematology and Oncology

## 2022-08-12 DIAGNOSIS — Z170 Estrogen receptor positive status [ER+]: Secondary | ICD-10-CM | POA: Diagnosis present

## 2022-08-12 DIAGNOSIS — C50412 Malignant neoplasm of upper-outer quadrant of left female breast: Secondary | ICD-10-CM | POA: Diagnosis present

## 2022-08-12 DIAGNOSIS — Z780 Asymptomatic menopausal state: Secondary | ICD-10-CM | POA: Insufficient documentation

## 2022-08-12 NOTE — Telephone Encounter (Signed)
RN placed call to pt per NP request regarding below information. Pt educated and verbalized understanding.

## 2022-08-12 NOTE — Telephone Encounter (Signed)
-----   Message from Gardenia Phlegm, NP sent at 08/12/2022  3:25 PM EDT ----- Please call patient and let her know that her bone density shows mild osteopenia.  Recommend repeating bone density testing in 2 years.  Thank you.  ----- Message ----- From: Interface, Rad Results In Sent: 08/12/2022  10:06 AM EDT To: Nicholas Lose, MD

## 2022-08-27 ENCOUNTER — Ambulatory Visit (HOSPITAL_BASED_OUTPATIENT_CLINIC_OR_DEPARTMENT_OTHER): Payer: Medicare Other | Admitting: Physical Therapy

## 2022-09-01 ENCOUNTER — Other Ambulatory Visit: Payer: Self-pay

## 2022-09-01 ENCOUNTER — Ambulatory Visit (HOSPITAL_BASED_OUTPATIENT_CLINIC_OR_DEPARTMENT_OTHER): Payer: Medicare Other | Attending: Sports Medicine | Admitting: Physical Therapy

## 2022-09-01 ENCOUNTER — Encounter (HOSPITAL_BASED_OUTPATIENT_CLINIC_OR_DEPARTMENT_OTHER): Payer: Self-pay | Admitting: Physical Therapy

## 2022-09-01 DIAGNOSIS — M25561 Pain in right knee: Secondary | ICD-10-CM | POA: Diagnosis not present

## 2022-09-01 DIAGNOSIS — M25661 Stiffness of right knee, not elsewhere classified: Secondary | ICD-10-CM | POA: Insufficient documentation

## 2022-09-01 DIAGNOSIS — M6281 Muscle weakness (generalized): Secondary | ICD-10-CM

## 2022-09-01 DIAGNOSIS — R262 Difficulty in walking, not elsewhere classified: Secondary | ICD-10-CM | POA: Insufficient documentation

## 2022-09-01 NOTE — Therapy (Signed)
OUTPATIENT PHYSICAL THERAPY LOWER EXTREMITY EVALUATION   Patient Name: Linda Li MRN: [id number] DOB:1945-11-22, 77 y.o., female Today's Date: 09/02/2022  END OF SESSION:  PT End of Session - 09/01/22 1026     Visit Number 1    Number of Visits 7    Date for PT Re-Evaluation 10/27/22    Authorization Type [employer] MCR    PT Start Time 1023    PT Stop Time 1113    PT Time Calculation (min) 50 min    Activity Tolerance Patient tolerated treatment well    Behavior During Therapy [facility] for tasks assessed/performed             Past Medical History:  Diagnosis Date   Arthritis    Breast cancer 06/2018   left   Cataract    Diabetes mellitus    Diverticulosis    Hyperlipidemia    Hypertension    Migraines    Obstructive sleep apnea    pressure is 14   Osteoporosis    Personal history of radiation therapy 2020   Left breast    Rosacea    Sleep apnea    daily c-pap   Spinal stenosis    Past Surgical History:  Procedure Laterality Date   BREAST LUMPECTOMY Left 07/08/2018   BREAST LUMPECTOMY WITH RADIOACTIVE SEED LOCALIZATION Left 07/08/2018   Procedure: LEFT BREAST LUMPECTOMY WITH RADIOACTIVE SEED LOCALIZATION;  Surgeon: Claud Kelp, MD;  Location:  SURGERY CENTER;  Service: General;  Laterality: Left;   COLONOSCOPY     KNEE ARTHROSCOPY     right torn meniscus   POLYPECTOMY     SHOULDER ARTHROSCOPY     left torn ligament   TONSILLECTOMY     Patient Active Problem List   Diagnosis Date Noted   Spinal stenosis of lumbar region with neurogenic claudication 07/19/2019   Spondylolisthesis of lumbar region 07/19/2019   Unilateral primary osteoarthritis, right knee 07/19/2019   Low back pain 06/27/2019   Obesity 06/27/2019   Closed fracture of left distal radius 06/01/2019   Thumb pain, left 05/02/2019   Malignant neoplasm of upper-outer quadrant of left breast in female, estrogen receptor positive 06/13/2018    PCP: Macy Mis,  MD  REFERRING PROVIDER: Elige Ko., MD  REFERRING DIAG: [phone] (ICD-10-CM) - Pain in right kne  THERAPY DIAG:  Right knee pain, unspecified chronicity  Muscle weakness (generalized)  Stiffness of right knee, not elsewhere classified  Difficulty in walking, not elsewhere classified  Rationale for Evaluation and Treatment: Rehabilitation  ONSET DATE: PT order 07/10/2022  SUBJECTIVE:   SUBJECTIVE STATEMENT: Pt states she has had issues with R knee for years.  Pt denies any specific exacerbation.  She states her sx's are not progressing, but pretty stable.  Pt states "It doesn't work very well.  I'm told my knee is bone on bone."  Pt reports having increased stiffness and difficulty with mobility after sitting for 30 mins.  She hasn't done as much recently and become weaker.  Pt is limited with ambulation distance due to her knee and lumbar. Pt has increased pain and difficulty with walking across parking lot.  Pt does use her loftstrand crutches some which makes her feel less vulnerable to falling and more secure. Pt has much difficulty with stairs and pulls herself up with the rails.  Pt has a chair lift on her stairs at home.  Pt has stopped doing yard work due to her back and knee.  Pt unable to  get off of the floor.  Pt has difficulty sleeping when she has do too much activity.  Pt states she has a long drive to get here and has a [employer] with a pool close by her.      PT order 07/10/2022.  PT order indicated aquatic therapy for R knee and spinal stenosis  Pt is having NCV studies due to numbness and pain in bilat feet.   PERTINENT HISTORY: Arthritis Spinal stenosis and Grade 2 spondylolisthesis of L4-L5. Metatarsalgia and numbness of bilat feet R knee surgery for torn meniscus DM Type 2 Migraine-controlled with meds HTN Osteoporosis which has recently improved osteopenia Obesity Breast CA in 2020 and had lumpectomy and radiation L Shoulder surgery Vision loss of R  eye  PAIN:  Are you having pain?  NPRS:  0/10 current and best,  10/10 worst= when something slips in the wrong place and she can't put weight on her R LE Location:  R knee  NPRS:  0/10 current and best, 3-4 /10 worst Location:  lumbar  PRECAUTIONS: Other: L4-5 spondylolisthesis and vision loss in R eye  WEIGHT BEARING RESTRICTIONS: No  FALLS:  Has patient fallen in last 6 months? No  LIVING ENVIRONMENT: Lives with: lives with their spouse Lives in: House/apartment;  2 story home Stairs: yes, but has a chair lift Has following equipment at home: lofstrand crutches, chair lift,  OCCUPATION: Pt is retired.  PLOF: Independent  PATIENT GOALS: improve strength   OBJECTIVE:   DIAGNOSTIC FINDINGS: X rays of R knee in 2021:  significant narrowing of medial joint space with subchondral sclerosis on both sides of the joint and 2-3 deg of varus.  Some deg change about the PF joint as well with narrowing of lateral patella facet. Consistent with advanced OA.  X rays of lumbar in 2021:  Grade 2 spondylolisthesis of L4-L5.  Facet jt changes at L4-5 and L5-S1.  PATIENT SURVEYS:  Give FOTO next visit.  COGNITION: Overall cognitive status: Within functional limits for tasks assessed        LOWER EXTREMITY ROM:    AROM Right eval Left eval  Hip flexion    Hip extension    Hip abduction    Hip adduction    Hip internal rotation    Hip external rotation    Knee flexion 112 127  Knee extension AROM/PROM: 5/2 0  Ankle dorsiflexion    Ankle plantarflexion    Ankle inversion    Ankle eversion     (Blank rows = not tested)  LOWER EXTREMITY MMT:  MMT Right eval Left eval  Hip flexion 5/5 4+/5  Hip extension    Hip abduction 4-/5 4/5  Hip adduction    Hip internal rotation    Hip external rotation 5/5 5/5  Knee flexion 5/5 seated 5/5 seated  Knee extension 5/5 5/5  Ankle dorsiflexion    Ankle plantarflexion    Ankle inversion    Ankle eversion     (Blank rows =  not tested)   GAIT: Assistive device utilized: None Level of assistance: Complete Independence Comments: antalgic gait favoring R LE with increased stance time on L LE   TODAY'S TREATMENT:  Pt performed supine heel slides, supine quad sets with 5 sec hold, supine SLR, seated clams with GTB x 10 reps each.  Pt also seated knee extension stretch with heel propped on stool x 1 min.  Pt received a HEP handout and was educated in correct form and appropriate frequency.    PATIENT EDUCATION:  Education details: HEP, dx, POC, objective findings, and relevant anatomy.   Person educated: Patient Education method: Explanation, Demonstration, Tactile cues, Verbal cues, and Handouts Education comprehension: verbalized understanding, returned demonstration, verbal cues required, tactile cues required, and needs further education  HOME EXERCISE PROGRAM: Access Code: [email or web address] URL: [email or web address] Date: 09/01/2022 Prepared by: Aaron Edelman  Exercises - Supine Quadricep Sets  - 2 x daily - 7 x weekly - 2 sets - 10 reps - 5 seconds hold - Supine Heel Slide  - 2 x daily - 7 x weekly - 2 sets - 10 reps - Supine Active Straight Leg Raise  - 1 x daily - 5-7 x weekly - 2 sets - 10 reps - Seated Hip Abduction with Resistance  - 1 x daily - 4-5 x weekly - 2 sets - 10 reps - Seated Knee Extension Stretch with Chair  - 2 x daily - 7 x weekly - 2 reps - 1 minute hold  ASSESSMENT:  CLINICAL IMPRESSION: Patient is a 77 y.o. female with a dx of R knee pain presenting to the clinic with limited ROM in R knee, R knee pain, and R hip abductor weakness.  Pt has increased stiffness and difficulty with mobility after sitting for 30 mins.  Pt is limited with ambulation distance due to her knee and lumbar including having increased pain and difficulty with walking across  parking lot.  Pt does use her loftstrand crutches some which makes her feel more secure. Pt has much difficulty with stairs and pulls herself up with the rails.  Pt uses a chair lift on her stairs at home.  Pt has stopped doing yard work due to her back and knee and is unable to get off of the floor.  Pt has good strength in R LE except weakness in hip abduction.  She has an antlagic gait.  Pt should benefit from skilled PT services to address impairments and to improve overall function.       OBJECTIVE IMPAIRMENTS: Abnormal gait, decreased activity tolerance, decreased mobility, difficulty walking, decreased ROM, decreased strength, hypomobility, impaired flexibility, and pain.   ACTIVITY LIMITATIONS: squatting, sleeping, stairs, transfers, and locomotion level  PARTICIPATION LIMITATIONS: community activity and yard work  PERSONAL FACTORS: Time since onset of injury/illness/exacerbation and 1-2 comorbidities: Metatarsalgia and DM type 2  are also affecting patient's functional outcome.   REHAB POTENTIAL: Good  CLINICAL DECISION MAKING: Stable/uncomplicated  EVALUATION COMPLEXITY: Low   GOALS:  SHORT TERM GOALS: Target date: 09/22/2022  Pt will tolerate aquatic therapy well without adverse effects for improved pain, tolerance to activity, strength, and mobility. Baseline: Goal status: INITIAL  2.  Pt will demo improved quality of gait having decreased antalgic limp with increased stance time thru R LE.   Baseline:  Goal status: INITIAL  3.  Pt will demo improved extension AROM to 0-2 deg for improved stiffness, ROM, and gait.   Baseline:  Goal status: INITIAL   LONG TERM GOALS: Target date: 10/27/2022  Pt will be independent with land and aquatic HEP for improved tolerance to activity, functional mobility, strength, and pain.  Baseline:  Goal status: INITIAL  2.  Pt will  demo improved R hip abd strength to 5/5 MMT for improved gait and performance of functional mobility.   Baseline:  Goal status: INITIAL  3.  Pt will report she is able to perform community ambulation with reduced knee pain and without significant difficulty.  Baseline:  Goal status: INITIAL  4.  Pt will report improved stability in knee with gait and at least a 60% improvement in pain and sx's overall.   Baseline:  Goal status: INITIAL     PLAN:  PT FREQUENCY: 1x/week  PT DURATION: 6-8 weeks  PLANNED INTERVENTIONS: Therapeutic exercises, Therapeutic activity, Neuromuscular re-education, Balance training, Gait training, Patient/Family education, Self Care, Stair training, Aquatic Therapy, Dry Needling, Electrical stimulation, Cryotherapy, Moist heat, Taping, Ultrasound, Manual therapy, and Re-evaluation  PLAN FOR NEXT SESSION:  Cont with Aquatic therapy.  check DF ROM.  Give FOTO next visit.   Audie Clear III PT, DPT 09/03/22 7:36 AM

## 2022-09-07 ENCOUNTER — Ambulatory Visit (HOSPITAL_BASED_OUTPATIENT_CLINIC_OR_DEPARTMENT_OTHER): Payer: Medicare Other | Admitting: Physical Therapy

## 2022-09-07 ENCOUNTER — Encounter (HOSPITAL_BASED_OUTPATIENT_CLINIC_OR_DEPARTMENT_OTHER): Payer: Self-pay | Admitting: Physical Therapy

## 2022-09-07 DIAGNOSIS — M25561 Pain in right knee: Secondary | ICD-10-CM

## 2022-09-07 DIAGNOSIS — M25661 Stiffness of right knee, not elsewhere classified: Secondary | ICD-10-CM

## 2022-09-07 DIAGNOSIS — R262 Difficulty in walking, not elsewhere classified: Secondary | ICD-10-CM

## 2022-09-07 DIAGNOSIS — M6281 Muscle weakness (generalized): Secondary | ICD-10-CM

## 2022-09-07 NOTE — Therapy (Signed)
OUTPATIENT PHYSICAL THERAPY LOWER EXTREMITY TREATMENT   Patient Name: Linda Li MRN: [id number] DOB:[birth year], 77 y.o., female Today's Date: 09/07/2022  END OF SESSION:  PT End of Session - 09/07/22 1158     Visit Number 2    Number of Visits 7    Date for PT Re-Evaluation 10/27/22    Authorization Type UHC MCR    PT Start Time 1200    PT Stop Time 1240    PT Time Calculation (min) 40 min    Behavior During Therapy [facility] for tasks assessed/performed             Past Medical History:  Diagnosis Date   Arthritis    Breast cancer 06/2018   left   Cataract    Diabetes mellitus    Diverticulosis    Hyperlipidemia    Hypertension    Migraines    Obstructive sleep apnea    pressure is 14   Osteoporosis    Personal history of radiation therapy 2020   Left breast    Rosacea    Sleep apnea    daily c-pap   Spinal stenosis    Past Surgical History:  Procedure Laterality Date   BREAST LUMPECTOMY Left 07/08/2018   BREAST LUMPECTOMY WITH RADIOACTIVE SEED LOCALIZATION Left 07/08/2018   Procedure: LEFT BREAST LUMPECTOMY WITH RADIOACTIVE SEED LOCALIZATION;  Surgeon: Claud Kelp, MD;  Location: [facility];  Service: General;  Laterality: Left;   COLONOSCOPY     KNEE ARTHROSCOPY     right torn meniscus   POLYPECTOMY     SHOULDER ARTHROSCOPY     left torn ligament   TONSILLECTOMY     Patient Active Problem List   Diagnosis Date Noted   Spinal stenosis of lumbar region with neurogenic claudication 07/19/2019   Spondylolisthesis of lumbar region 07/19/2019   Unilateral primary osteoarthritis, right knee 07/19/2019   Low back pain 06/27/2019   Obesity 06/27/2019   Closed fracture of left distal radius 06/01/2019   Thumb pain, left 05/02/2019   Malignant neoplasm of upper-outer quadrant of left breast in female, estrogen receptor positive 06/13/2018    PCP: Macy Mis, MD  REFERRING PROVIDER: Elige Ko., MD  REFERRING DIAG:  [phone] (ICD-10-CM) - Pain in right kne  THERAPY DIAG:  Right knee pain, unspecified chronicity  Muscle weakness (generalized)  Stiffness of right knee, not elsewhere classified  Difficulty in walking, not elsewhere classified  Rationale for Evaluation and Treatment: Rehabilitation  ONSET DATE: PT order 07/10/2022  SUBJECTIVE:   SUBJECTIVE STATEMENT: Pt reports she would like to learn some things that she can do at the [facility].  She states she completed HEP from eval, but it made her knees very sore.   FROM EVAL: Pt states she has had issues with R knee for years.  Pt denies any specific exacerbation.  She states her sx's are not progressing, but pretty stable.  Pt states "It doesn't work very well.  I'm told my knee is bone on bone."  Pt reports having increased stiffness and difficulty with mobility after sitting for 30 mins.  She hasn't done as much recently and become weaker.  Pt is limited with ambulation distance due to her knee and lumbar. Pt has increased pain and difficulty with walking across parking lot.  Pt does use her loftstrand crutches some which makes her feel less vulnerable to falling and more secure. Pt has much difficulty with stairs and pulls herself up with the rails.  Pt has a chair lift on her stairs at home.  Pt has stopped doing yard work due to her back and knee.  Pt unable to get off of the floor.  Pt has difficulty sleeping when she has do too much activity.  Pt states she has a long drive to get here and has a [employer] with a pool close by her.      PT order 07/10/2022.  PT order indicated aquatic therapy for R knee and spinal stenosis  Pt is having NCV studies due to numbness and pain in bilat feet.   PERTINENT HISTORY: Arthritis Spinal stenosis and Grade 2 spondylolisthesis of L4-L5. Metatarsalgia and numbness of bilat feet R knee surgery for torn meniscus DM Type 2 Migraine-controlled with meds HTN Osteoporosis which has recently improved  osteopenia Obesity Breast CA in 2020 and had lumpectomy and radiation L Shoulder surgery Vision loss of R eye  PAIN:  Are you having pain? Yes  NPRS:  0/10 at rest, 3/10 when walking  Location:  R knee   PRECAUTIONS: Other: L4-5 spondylolisthesis and vision loss in R eye  WEIGHT BEARING RESTRICTIONS: No  FALLS:  Has patient fallen in last 6 months? No  LIVING ENVIRONMENT: Lives with: lives with their spouse Lives in: House/apartment;  2 story home Stairs: yes, but has a chair lift Has following equipment at home: lofstrand crutches, chair lift,  OCCUPATION: Pt is retired.  PLOF: Independent  PATIENT GOALS: improve strength   OBJECTIVE:   DIAGNOSTIC FINDINGS: X rays of R knee in 2021:  significant narrowing of medial joint space with subchondral sclerosis on both sides of the joint and 2-3 deg of varus.  Some deg change about the PF joint as well with narrowing of lateral patella facet. Consistent with advanced OA.  X rays of lumbar in 2021:  Grade 2 spondylolisthesis of L4-L5.  Facet jt changes at L4-5 and L5-S1.  PATIENT SURVEYS:  Give FOTO next visit.  COGNITION: Overall cognitive status: Within functional limits for tasks assessed        LOWER EXTREMITY ROM:    AROM Right eval Left eval  Hip flexion    Hip extension    Hip abduction    Hip adduction    Hip internal rotation    Hip external rotation    Knee flexion 112 127  Knee extension AROM/PROM: 5/2 0  Ankle dorsiflexion    Ankle plantarflexion    Ankle inversion    Ankle eversion     (Blank rows = not tested)  LOWER EXTREMITY MMT:  MMT Right eval Left eval  Hip flexion 5/5 4+/5  Hip extension    Hip abduction 4-/5 4/5  Hip adduction    Hip internal rotation    Hip external rotation 5/5 5/5  Knee flexion 5/5 seated 5/5 seated  Knee extension 5/5 5/5  Ankle dorsiflexion    Ankle plantarflexion    Ankle inversion    Ankle eversion     (Blank rows = not  tested)   GAIT: Assistive device utilized: None Level of assistance: Complete Independence Comments: antalgic gait favoring R LE with increased stance time on L LE   TODAY'S TREATMENT:  Pt seen for aquatic therapy today.  Treatment took place in water 3.5-4.75 ft in depth at the [facility] pool. Temp of water was 91.  Pt entered/exited the pool via stairs independently In step-to pattern with bilat rail. * with/without support of noodle:  forward/ backward walking with cues for even step length, heel/toe pattern - 3 laps * with / without support of noodle:  side stepping -> 2nd lap with arm addct -> 3rd lap with some arm addct with rainbow hand floats (challenge) * marching backward/forward with alternating row with rainbow hand floats on surface * holding wall:  hip abdct/addct x 10 each;  relaxed squat x 10 (vertical trunk) ;heel/ toe raises * TrA set with partial pull downs of short blue noodle (challenge) * seated on bench:  cycling, long sitting hip abdct/ addct; alternating LAQ with DF  Pt requires the buoyancy and hydrostatic pressure of water for support, and to offload joints by unweighting joint load by at least 50 % in navel deep water and by at least 75-80% in chest to neck deep water.  Viscosity of the water is needed for resistance of strengthening. Water current perturbations provides challenge to standing balance requiring increased core activation.    PATIENT EDUCATION:  Education details: aquatic therapy intro   Person educated: Patient Education method: Explanation, Demonstration, Actor cues, Verbal cues Education comprehension: verbalized understanding, returned demonstration, verbal cues required, tactile cues required, and needs further education  HOME EXERCISE PROGRAM: Access Code: [email or web address] URL:  [email or web address] Date: 09/01/2022 Prepared by: Aaron Edelman  Exercises - Supine Quadricep Sets  - 2 x daily - 7 x weekly - 2 sets - 10 reps - 5 seconds hold - Supine Heel Slide  - 2 x daily - 7 x weekly - 2 sets - 10 reps - Supine Active Straight Leg Raise  - 1 x daily - 5-7 x weekly - 2 sets - 10 reps - Seated Hip Abduction with Resistance  - 1 x daily - 4-5 x weekly - 2 sets - 10 reps - Seated Knee Extension Stretch with Chair  - 2 x daily - 7 x weekly - 2 reps - 1 minute hold  ASSESSMENT:  CLINICAL IMPRESSION: Pt is confident in aquatic setting and able to take direction from therapist on deck.  She complained of intermittent unsteadiness; holding floatation device helped some.  She required some cues for more even step length with gait and avoid locking knees. She reported some irritation in Rt knee during session. Goals are ongoing.  Pt should benefit from skilled PT services to address impairments and to improve overall function.  Will plan to create aquatic HEP in upcoming visit.    FROM EVAL: Patient is a 77 y.o. female with a dx of R knee pain presenting to the clinic with limited ROM in R knee, R knee pain, and R hip abductor weakness.  Pt has increased stiffness and difficulty with mobility after sitting for 30 mins.  Pt is limited with ambulation distance due to her knee and lumbar including having increased pain and difficulty with walking across parking lot.  Pt does use her loftstrand crutches some which makes her feel more secure. Pt has much difficulty with stairs and pulls herself up with the rails.  Pt uses a chair lift on her stairs at home.  Pt has stopped doing yard work due to her back and knee and is unable to get off of the floor.  Pt has good strength in R  LE except weakness in hip abduction.  She has an antlagic gait.  Pt should benefit from skilled PT services to address impairments and to improve overall function.       OBJECTIVE IMPAIRMENTS:  Abnormal gait, decreased activity tolerance, decreased mobility, difficulty walking, decreased ROM, decreased strength, hypomobility, impaired flexibility, and pain.   ACTIVITY LIMITATIONS: squatting, sleeping, stairs, transfers, and locomotion level  PARTICIPATION LIMITATIONS: community activity and yard work  PERSONAL FACTORS: Time since onset of injury/illness/exacerbation and 1-2 comorbidities: Metatarsalgia and DM type 2  are also affecting patient's functional outcome.   REHAB POTENTIAL: Good  CLINICAL DECISION MAKING: Stable/uncomplicated  EVALUATION COMPLEXITY: Low   GOALS:  SHORT TERM GOALS: Target date: 09/22/2022  Pt will tolerate aquatic therapy well without adverse effects for improved pain, tolerance to activity, strength, and mobility. Baseline: Goal status: INITIAL  2.  Pt will demo improved quality of gait having decreased antalgic limp with increased stance time thru R LE.   Baseline:  Goal status: INITIAL  3.  Pt will demo improved extension AROM to 0-2 deg for improved stiffness, ROM, and gait.   Baseline:  Goal status: INITIAL   LONG TERM GOALS: Target date: 10/27/2022  Pt will be independent with land and aquatic HEP for improved tolerance to activity, functional mobility, strength, and pain.  Baseline:  Goal status: INITIAL  2.  Pt will demo improved R hip abd strength to 5/5 MMT for improved gait and performance of functional mobility.  Baseline:  Goal status: INITIAL  3.  Pt will report she is able to perform community ambulation with reduced knee pain and without significant difficulty.  Baseline:  Goal status: INITIAL  4.  Pt will report improved stability in knee with gait and at least a 60% improvement in pain and sx's overall.   Baseline:  Goal status: INITIAL     PLAN:  PT FREQUENCY: 1x/week  PT DURATION: 6-8 weeks  PLANNED INTERVENTIONS: Therapeutic exercises, Therapeutic activity, Neuromuscular re-education, Balance training,  Gait training, Patient/Family education, Self Care, Stair training, Aquatic Therapy, Dry Needling, Electrical stimulation, Cryotherapy, Moist heat, Taping, Ultrasound, Manual therapy, and Re-evaluation  PLAN FOR NEXT SESSION:  Cont with Aquatic therapy.  check DF ROM.    Marland KitchenMayer Camel, PTA 09/07/22 12:58 PM [facility] [address] [place], [place], [address] Phone: [phone]   Fax:  [phone]

## 2022-09-15 ENCOUNTER — Encounter (HOSPITAL_BASED_OUTPATIENT_CLINIC_OR_DEPARTMENT_OTHER): Payer: Self-pay

## 2022-09-15 ENCOUNTER — Ambulatory Visit (HOSPITAL_BASED_OUTPATIENT_CLINIC_OR_DEPARTMENT_OTHER): Payer: Medicare Other | Admitting: Physical Therapy

## 2022-09-24 ENCOUNTER — Encounter (HOSPITAL_BASED_OUTPATIENT_CLINIC_OR_DEPARTMENT_OTHER): Payer: Self-pay | Admitting: Physical Therapy

## 2022-09-24 ENCOUNTER — Ambulatory Visit (HOSPITAL_BASED_OUTPATIENT_CLINIC_OR_DEPARTMENT_OTHER): Payer: Medicare Other | Attending: Sports Medicine | Admitting: Physical Therapy

## 2022-09-24 DIAGNOSIS — M25561 Pain in right knee: Secondary | ICD-10-CM | POA: Insufficient documentation

## 2022-09-24 DIAGNOSIS — R262 Difficulty in walking, not elsewhere classified: Secondary | ICD-10-CM | POA: Insufficient documentation

## 2022-09-24 DIAGNOSIS — M6281 Muscle weakness (generalized): Secondary | ICD-10-CM

## 2022-09-24 DIAGNOSIS — M25661 Stiffness of right knee, not elsewhere classified: Secondary | ICD-10-CM | POA: Diagnosis present

## 2022-09-24 NOTE — Therapy (Signed)
OUTPATIENT PHYSICAL THERAPY LOWER EXTREMITY TREATMENT   Patient Name: Linda Li MRN: [id number] DOB:May 25, 1945, 77 y.o., female Today's Date: 09/25/2022  END OF SESSION:  PT End of Session - 09/24/22 1055     Visit Number 3    Number of Visits 7    Date for PT Re-Evaluation 10/27/22    Authorization Type [employer] MCR    PT Start Time 1018    PT Stop Time 1100    PT Time Calculation (min) 42 min    Activity Tolerance Patient tolerated treatment well    Behavior During Therapy WFL for tasks assessed/performed              Past Medical History:  Diagnosis Date   Arthritis    Breast cancer (HCC) 06/2018   left   Cataract    Diabetes mellitus (HCC)    Diverticulosis    Hyperlipidemia    Hypertension    Migraines    Obstructive sleep apnea    pressure is 14   Osteoporosis    Personal history of radiation therapy 2020   Left breast    Rosacea    Sleep apnea    daily c-pap   Spinal stenosis    Past Surgical History:  Procedure Laterality Date   BREAST LUMPECTOMY Left 07/08/2018   BREAST LUMPECTOMY WITH RADIOACTIVE SEED LOCALIZATION Left 07/08/2018   Procedure: LEFT BREAST LUMPECTOMY WITH RADIOACTIVE SEED LOCALIZATION;  Surgeon: Claud Kelp, MD;  Location: [facility];  Service: General;  Laterality: Left;   COLONOSCOPY     KNEE ARTHROSCOPY     right torn meniscus   POLYPECTOMY     SHOULDER ARTHROSCOPY     left torn ligament   TONSILLECTOMY     Patient Active Problem List   Diagnosis Date Noted   Spinal stenosis of lumbar region with neurogenic claudication 07/19/2019   Spondylolisthesis of lumbar region 07/19/2019   Unilateral primary osteoarthritis, right knee 07/19/2019   Low back pain 06/27/2019   Obesity 06/27/2019   Closed fracture of left distal radius 06/01/2019   Thumb pain, left 05/02/2019   Malignant neoplasm of upper-outer quadrant of left breast in female, estrogen receptor positive (HCC) 06/13/2018    PCP: Macy Mis, MD  REFERRING PROVIDER: Elige Ko., MD  REFERRING DIAG: [phone] (ICD-10-CM) - Pain in right kne  THERAPY DIAG:  Right knee pain, unspecified chronicity  Muscle weakness (generalized)  Stiffness of right knee, not elsewhere classified  Difficulty in walking, not elsewhere classified  Rationale for Evaluation and Treatment: Rehabilitation  ONSET DATE: PT order 07/10/2022  SUBJECTIVE:   SUBJECTIVE STATEMENT: Pt states she had increased knee pain with home exercises and stopped doing her exercises.  She also has numerous things going on in her life which has impacted her ability to perform HEP as well.  Pt states her R knee is a little straighter now.  Pt states she was tired and a little achy though had no adverse effects after prior Rx.  Pt is in a rental car and has difficulty bending her knee to get in/out of car.      PERTINENT HISTORY: Arthritis Spinal stenosis and Grade 2 spondylolisthesis of L4-L5. Metatarsalgia and numbness of bilat feet R knee surgery for torn meniscus DM Type 2 Migraine-controlled with meds HTN Osteoporosis which has recently improved osteopenia Obesity Breast CA in 2020 and had lumpectomy and radiation L Shoulder surgery Vision loss of R eye  PAIN:  Are you having pain? Yes  NPRS:  0/10 at rest, 3/10 when walking  Location:  R knee   PRECAUTIONS: Other: L4-5 spondylolisthesis and vision loss in R eye  WEIGHT BEARING RESTRICTIONS: No  FALLS:  Has patient fallen in last 6 months? No  LIVING ENVIRONMENT: Lives with: lives with their spouse Lives in: House/apartment;  2 story home Stairs: yes, but has a chair lift Has following equipment at home: lofstrand crutches, chair lift,  OCCUPATION: Pt is retired.  PLOF: Independent  PATIENT GOALS: improve strength   OBJECTIVE:   DIAGNOSTIC FINDINGS: X rays of R knee in 2021:  significant narrowing of medial joint space with subchondral sclerosis on both sides of the joint and  2-3 deg of varus.  Some deg change about the PF joint as well with narrowing of lateral patella facet. Consistent with advanced OA.  X rays of lumbar in 2021:  Grade 2 spondylolisthesis of L4-L5.  Facet jt changes at L4-5 and L5-S1.    TODAY'S TREATMENT:                                                                                                                              Pt seen for aquatic therapy today.  Treatment took place in water 3.5-4.75 ft in depth at the [facility] pool. Temp of water was 91.  Pt entered/exited the pool via stairs independently In step-to pattern with bilat rail. * with/without support of noodle:  forward/ backward walking with cues for even step length, heel/toe pattern - 3 laps * without support of noodle:  side stepping x 2 laps without hand floats > 3rd lap with some arm addct with rainbow hand floats (challenge) * marching forward with alternating row with rainbow hand floats on surface * holding wall:  marching 2x10, hip abdct/addct 2 x 10 each; heel/ toe raises 2x10 * seated on bench:  cycling x 30, long sitting hip abdct/ addct x 20; alternating LAQ with DF x20 reps *sit to stands from bench x 10 reps  Pt requires the buoyancy and hydrostatic pressure of water for support, and to offload joints by unweighting joint load by at least 50 % in navel deep water and by at least 75-80% in chest to neck deep water.  Viscosity of the water is needed for resistance of strengthening. Water current perturbations provides challenge to standing balance requiring increased core activation.    PATIENT EDUCATION:  Education details: aquatic properties, purpose, and benefits.  Exercise form and rationale, relevant anatomy Person educated: Patient Education method: Explanation, Demonstration, Verbal cues Education comprehension:  verbalized understanding, returned demonstration, verbal cues required, and needs further education  HOME EXERCISE PROGRAM: Access  Code: [email or web address] URL: [email or web address] Date: 09/01/2022 Prepared by: Aaron Edelman  Exercises - Supine Quadricep Sets  - 2 x daily - 7 x weekly - 2 sets - 10 reps - 5 seconds hold - Supine Heel Slide  - 2 x daily - 7 x weekly - 2 sets -  10 reps - Supine Active Straight Leg Raise  - 1 x daily - 5-7 x weekly - 2 sets - 10 reps - Seated Hip Abduction with Resistance  - 1 x daily - 4-5 x weekly - 2 sets - 10 reps - Seated Knee Extension Stretch with Chair  - 2 x daily - 7 x weekly - 2 reps - 1 minute hold  ASSESSMENT:  CLINICAL IMPRESSION: Pt presents to aquatic treatment with lofstrand crutches today.  Pt performed aquatic exercises well with cuing and instruction in correct form.  PT instructed pt in proper gait pattern.  She required increased cuing and instruction with marching with DB alternating rows.  Pt tolerated aquatic exercises well.  She responded well to Rx stating she felt good after Rx having no pain.  Pt should benefit from cont skilled PT services to address impairments and to improve overall function.        OBJECTIVE IMPAIRMENTS: Abnormal gait, decreased activity tolerance, decreased mobility, difficulty walking, decreased ROM, decreased strength, hypomobility, impaired flexibility, and pain.   ACTIVITY LIMITATIONS: squatting, sleeping, stairs, transfers, and locomotion level  PARTICIPATION LIMITATIONS: community activity and yard work  PERSONAL FACTORS: Time since onset of injury/illness/exacerbation and 1-2 comorbidities: Metatarsalgia and DM type 2  are also affecting patient's functional outcome.   REHAB POTENTIAL: Good  CLINICAL DECISION MAKING: Stable/uncomplicated  EVALUATION COMPLEXITY: Low   GOALS:  SHORT TERM GOALS: Target date: 09/22/2022  Pt will tolerate aquatic therapy well without adverse effects for improved pain, tolerance to activity, strength, and mobility. Baseline: Goal status: INITIAL  2.  Pt will demo improved quality of  gait having decreased antalgic limp with increased stance time thru R LE.   Baseline:  Goal status: INITIAL  3.  Pt will demo improved extension AROM to 0-2 deg for improved stiffness, ROM, and gait.   Baseline:  Goal status: INITIAL   LONG TERM GOALS: Target date: 10/27/2022  Pt will be independent with land and aquatic HEP for improved tolerance to activity, functional mobility, strength, and pain.  Baseline:  Goal status: INITIAL  2.  Pt will demo improved R hip abd strength to 5/5 MMT for improved gait and performance of functional mobility.  Baseline:  Goal status: INITIAL  3.  Pt will report she is able to perform community ambulation with reduced knee pain and without significant difficulty.  Baseline:  Goal status: INITIAL  4.  Pt will report improved stability in knee with gait and at least a 60% improvement in pain and sx's overall.   Baseline:  Goal status: INITIAL     PLAN:  PT FREQUENCY: 1x/week  PT DURATION: 6-8 weeks  PLANNED INTERVENTIONS: Therapeutic exercises, Therapeutic activity, Neuromuscular re-education, Balance training, Gait training, Patient/Family education, Self Care, Stair training, Aquatic Therapy, Dry Needling, Electrical stimulation, Cryotherapy, Moist heat, Taping, Ultrasound, Manual therapy, and Re-evaluation  PLAN FOR NEXT SESSION:  Cont with Aquatic therapy.  check DF ROM.  Give LEFS.  Audie Clear III PT, DPT 09/25/22 12:20 PM

## 2022-09-29 ENCOUNTER — Ambulatory Visit (HOSPITAL_BASED_OUTPATIENT_CLINIC_OR_DEPARTMENT_OTHER): Payer: Medicare Other | Admitting: Physical Therapy

## 2022-09-29 ENCOUNTER — Encounter (HOSPITAL_BASED_OUTPATIENT_CLINIC_OR_DEPARTMENT_OTHER): Payer: Self-pay | Admitting: Physical Therapy

## 2022-09-29 DIAGNOSIS — M6281 Muscle weakness (generalized): Secondary | ICD-10-CM

## 2022-09-29 DIAGNOSIS — M25661 Stiffness of right knee, not elsewhere classified: Secondary | ICD-10-CM

## 2022-09-29 DIAGNOSIS — M25561 Pain in right knee: Secondary | ICD-10-CM | POA: Diagnosis not present

## 2022-09-29 DIAGNOSIS — R262 Difficulty in walking, not elsewhere classified: Secondary | ICD-10-CM

## 2022-09-29 NOTE — Therapy (Addendum)
OUTPATIENT PHYSICAL THERAPY LOWER EXTREMITY TREATMENT  PHYSICAL THERAPY DISCHARGE SUMMARY  Visits from Start of Care: 4  Current functional level related to goals / functional outcomes: unknown   Remaining deficits: unknown   Education / Equipment: Initial Pt edu   Patient agrees to discharge. Patient goals were partially met. Patient is being discharged due to not returning since the last visit.  Addend [facility]: Lambert Rhonda W) Ziemba MPT 07/07/23 9:28 AM [facility] [address] [place], [place], [address] Phone: [phone]   Fax:  [phone]   Patient Name: Linda Li MRN: [id number] DOB:June 09, 1945, 77 y.o., female Today's Date: 09/29/2022  END OF SESSION:  PT End of Session - 09/29/22 0945     Visit Number 4    Date for PT Re-Evaluation 10/27/22    Authorization Type [employer] MCR    PT Start Time 0945    PT Stop Time 1025    PT Time Calculation (min) 40 min    Behavior During Therapy [facility] for tasks assessed/performed              Past Medical History:  Diagnosis Date   Arthritis    Breast cancer (HCC) 06/2018   left   Cataract    Diabetes mellitus (HCC)    Diverticulosis    Hyperlipidemia    Hypertension    Migraines    Obstructive sleep apnea    pressure is 14   Osteoporosis    Personal history of radiation therapy 2020   Left breast    Rosacea    Sleep apnea    daily c-pap   Spinal stenosis    Past Surgical History:  Procedure Laterality Date   BREAST LUMPECTOMY Left 07/08/2018   BREAST LUMPECTOMY WITH RADIOACTIVE SEED LOCALIZATION Left 07/08/2018   Procedure: LEFT BREAST LUMPECTOMY WITH RADIOACTIVE SEED LOCALIZATION;  Surgeon: Claud Kelp, MD;  Location: [facility];  Service: General;  Laterality: Left;   COLONOSCOPY     KNEE ARTHROSCOPY     right torn meniscus   POLYPECTOMY     SHOULDER ARTHROSCOPY     left torn ligament   TONSILLECTOMY     Patient Active  Problem List   Diagnosis Date Noted   Spinal stenosis of lumbar region with neurogenic claudication 07/19/2019   Spondylolisthesis of lumbar region 07/19/2019   Unilateral primary osteoarthritis, right knee 07/19/2019   Low back pain 06/27/2019   Obesity 06/27/2019   Closed fracture of left distal radius 06/01/2019   Thumb pain, left 05/02/2019   Malignant neoplasm of upper-outer quadrant of left breast in female, estrogen receptor positive (HCC) 06/13/2018    PCP: Macy Mis, MD  REFERRING PROVIDER: Elige Ko., MD  REFERRING DIAG: [phone] (ICD-10-CM) - Pain in right kne  THERAPY DIAG:  Right knee pain, unspecified chronicity  Muscle weakness (generalized)  Stiffness of right knee, not elsewhere classified  Difficulty in walking, not elsewhere classified  Rationale for Evaluation and Treatment: Rehabilitation  ONSET DATE: PT order 07/10/2022  SUBJECTIVE:   SUBJECTIVE STATEMENT: Pt states she had a good nights sleep so her Rt knee feels pretty good today.  She states she has a lot going on right now so she's had to adjust her appts.    PERTINENT HISTORY: Arthritis Spinal stenosis and Grade 2 spondylolisthesis of L4-L5. Metatarsalgia and numbness of bilat feet R knee surgery for torn meniscus DM Type 2 Migraine-controlled with meds HTN Osteoporosis which has recently improved osteopenia Obesity Breast CA in 2020  and had lumpectomy and radiation L Shoulder surgery Vision loss of R eye  PAIN:  Are you having pain? no NPRS:  0/10 at rest Location:  R knee Description:    PRECAUTIONS: Other: L4-5 spondylolisthesis and vision loss in R eye  WEIGHT BEARING RESTRICTIONS: No  FALLS:  Has patient fallen in last 6 months? No  LIVING ENVIRONMENT: Lives with: lives with their spouse Lives in: House/apartment;  2 story home Stairs: yes, but has a chair lift Has following equipment at home: lofstrand crutches, chair lift,  OCCUPATION: Pt is  retired.  PLOF: Independent  PATIENT GOALS: improve strength   OBJECTIVE:   DIAGNOSTIC FINDINGS: X rays of R knee in 2021:  significant narrowing of medial joint space with subchondral sclerosis on both sides of the joint and 2-3 deg of varus.  Some deg change about the PF joint as well with narrowing of lateral patella facet. Consistent with advanced OA.  X rays of lumbar in 2021:  Grade 2 spondylolisthesis of L4-L5.  Facet jt changes at L4-5 and L5-S1.    TODAY'S TREATMENT:                                                                                                                              Pt seen for aquatic therapy today.  Treatment took place in water 3.5-4.75 ft in depth at the [facility] pool. Temp of water was 91.  Pt entered/exited the pool via stairs independently In step-to pattern with bilat rail. * without support of noodle:  forward/ backward walking with cues for even step length; high knee marching forward/backward x 2 laps *  side stepping with rainbow hand floats (balance challenge)  * holding wall: hip abdct/addct x10 each; heel/ toe raises x10; outward LE circles x 10 * holding hand floats:  3 way toe tap x 5 each LE (balance challenge) * return to walking with reciprocal arms swing * holding yellow hand floats:  tandem stance x 20s; tandem gait forward/ backward  * Holding wall:  relaxed squats with vertical trunk x 10; single leg clams x 10 each LE * holding yellow hand floats:  forward walking kicks  * at stairs:  R/L hamstring stretches x 3; bilat gastroc stretch x 20s  Pt requires the buoyancy and hydrostatic pressure of water for support, and to offload joints by unweighting joint load by at least 50 % in navel deep water and by at least 75-80% in chest to neck deep water.  Viscosity of the water is needed for resistance of strengthening. Water current perturbations provides challenge to standing balance requiring increased core  activation.    PATIENT EDUCATION:  Education details: aquatic properties, purpose, and benefits.  Exercise form and rationale, relevant anatomy Person educated: Patient Education method: Explanation, Demonstration, Verbal cues Education comprehension:  verbalized understanding, returned demonstration, verbal cues required, and needs further education  HOME EXERCISE PROGRAM: Access Code: [email or web address] URL: [email or web address] Date: 09/01/2022 Prepared  by: Aaron Edelman  Exercises - Supine Quadricep Sets  - 2 x daily - 7 x weekly - 2 sets - 10 reps - 5 seconds hold - Supine Heel Slide  - 2 x daily - 7 x weekly - 2 sets - 10 reps - Supine Active Straight Leg Raise  - 1 x daily - 5-7 x weekly - 2 sets - 10 reps - Seated Hip Abduction with Resistance  - 1 x daily - 4-5 x weekly - 2 sets - 10 reps - Seated Knee Extension Stretch with Chair  - 2 x daily - 7 x weekly - 2 reps - 1 minute hold  Access Code: [email or web address] URL: [email or web address] Date: 09/29/2022 Prepared by: [facility] This aquatic home exercise program from [employer] utilizes pictures from land based exercises, but has been adapted prior to lamination and issuance.   ASSESSMENT:  CLINICAL IMPRESSION: Pt presents to aquatic treatment with lofstrand crutches today.  Pt performed aquatic exercises well with cuing and instruction in correct form.  She responded well to treatment report of no increase in pain.  Pt should benefit from cont skilled PT services to address impairments and to improve overall function.  Issued laminated HEP for pt to utilize at local pool until next scheduled visit in June. She has met STG1 and is progressing towards remaining goals.       OBJECTIVE IMPAIRMENTS: Abnormal gait, decreased activity tolerance, decreased mobility, difficulty walking, decreased ROM, decreased strength, hypomobility, impaired flexibility, and pain.   ACTIVITY  LIMITATIONS: squatting, sleeping, stairs, transfers, and locomotion level  PARTICIPATION LIMITATIONS: community activity and yard work  PERSONAL FACTORS: Time since onset of injury/illness/exacerbation and 1-2 comorbidities: Metatarsalgia and DM type 2  are also affecting patient's functional outcome.   REHAB POTENTIAL: Good  CLINICAL DECISION MAKING: Stable/uncomplicated  EVALUATION COMPLEXITY: Low   GOALS:  SHORT TERM GOALS: Target date: 09/22/2022  Pt will tolerate aquatic therapy well without adverse effects for improved pain, tolerance to activity, strength, and mobility. Baseline: Goal status: MET - 09/29/22  2.  Pt will demo improved quality of gait having decreased antalgic limp with increased stance time thru R LE.   Baseline:  Goal status: ONGOING  3.  Pt will demo improved extension AROM to 0-2 deg for improved stiffness, ROM, and gait.   Baseline:  Goal status: ONGOING   LONG TERM GOALS: Target date: 10/27/2022  Pt will be independent with land and aquatic HEP for improved tolerance to activity, functional mobility, strength, and pain.  Baseline:  Goal status: INITIAL  2.  Pt will demo improved R hip abd strength to 5/5 MMT for improved gait and performance of functional mobility.  Baseline:  Goal status: INITIAL  3.  Pt will report she is able to perform community ambulation with reduced knee pain and without significant difficulty.  Baseline:  Goal status: INITIAL  4.  Pt will report improved stability in knee with gait and at least a 60% improvement in pain and sx's overall.   Baseline:  Goal status: INITIAL     PLAN:  PT FREQUENCY: 1x/week  PT DURATION: 6-8 weeks  PLANNED INTERVENTIONS: Therapeutic exercises, Therapeutic activity, Neuromuscular re-education, Balance training, Gait training, Patient/Family education, Self Care, Stair training, Aquatic Therapy, Dry Needling, Electrical stimulation, Cryotherapy, Moist heat, Taping, Ultrasound, Manual  therapy, and Re-evaluation  PLAN FOR NEXT SESSION:  Cont with Aquatic therapy.  Assess goals.  Mayer Camel, PTA 09/29/22 10:34 AM [facility]  [address] [place], [place], [address] Phone: [phone]   Fax:  [phone]

## 2022-10-08 ENCOUNTER — Ambulatory Visit (HOSPITAL_BASED_OUTPATIENT_CLINIC_OR_DEPARTMENT_OTHER): Payer: Medicare Other | Admitting: Physical Therapy

## 2022-10-23 ENCOUNTER — Ambulatory Visit (HOSPITAL_BASED_OUTPATIENT_CLINIC_OR_DEPARTMENT_OTHER): Payer: Medicare Other | Admitting: Physical Therapy

## 2022-11-02 ENCOUNTER — Ambulatory Visit (HOSPITAL_BASED_OUTPATIENT_CLINIC_OR_DEPARTMENT_OTHER): Payer: Medicare Other | Admitting: Physical Therapy

## 2023-05-02 ENCOUNTER — Other Ambulatory Visit: Payer: Self-pay | Admitting: Hematology and Oncology

## 2023-05-08 ENCOUNTER — Other Ambulatory Visit: Payer: Self-pay | Admitting: Hematology and Oncology

## 2023-05-18 ENCOUNTER — Other Ambulatory Visit: Payer: Self-pay | Admitting: Family Medicine

## 2023-05-18 DIAGNOSIS — Z1231 Encounter for screening mammogram for malignant neoplasm of breast: Secondary | ICD-10-CM

## 2023-06-28 ENCOUNTER — Ambulatory Visit
Admission: RE | Admit: 2023-06-28 | Discharge: 2023-06-28 | Disposition: A | Discharge status: Home / Self Care | Payer: Medicare Other | Source: Ambulatory Visit | Attending: Family Medicine | Admitting: Family Medicine

## 2023-06-28 DIAGNOSIS — Z1231 Encounter for screening mammogram for malignant neoplasm of breast: Secondary | ICD-10-CM

## 2023-07-19 ENCOUNTER — Ambulatory Visit: Payer: Medicare Other | Admitting: Hematology and Oncology

## 2023-08-02 ENCOUNTER — Inpatient Hospital Stay: Payer: Medicare Other | Attending: Hematology and Oncology | Admitting: Hematology and Oncology

## 2023-08-02 VITALS — BP 129/63 | HR 75 | Temp 97.3°F | Resp 17 | Ht 67.5 in | Wt 271.5 lb

## 2023-08-02 DIAGNOSIS — Z170 Estrogen receptor positive status [ER+]: Secondary | ICD-10-CM | POA: Insufficient documentation

## 2023-08-02 DIAGNOSIS — C50412 Malignant neoplasm of upper-outer quadrant of left female breast: Secondary | ICD-10-CM | POA: Insufficient documentation

## 2023-08-02 DIAGNOSIS — Z79811 Long term (current) use of aromatase inhibitors: Secondary | ICD-10-CM | POA: Diagnosis not present

## 2023-08-02 MED ORDER — ALENDRONATE SODIUM 70 MG PO TABS: 70.0000 mg | ORAL_TABLET | ORAL | 3 refills | Status: AC

## 2023-08-02 NOTE — Progress Notes (Signed)
 Patient Care Team: Macy Mis, MD as PCP - General (Family Medicine) Claud Kelp, MD as Consulting Physician (General Surgery) Serena Croissant, MD as Consulting Physician (Hematology and Oncology) Antony Blackbird, MD as Consulting Physician (Radiation Oncology)  DIAGNOSIS:  Encounter Diagnosis  Name Primary?   Malignant neoplasm of upper-outer quadrant of left breast in female, estrogen receptor positive (HCC) Yes    SUMMARY OF ONCOLOGIC HISTORY: Oncology History  Malignant neoplasm of upper-outer quadrant of left breast in female, estrogen receptor positive (HCC)  06/13/2018 Initial Diagnosis   Screening detected left breast asymmetry with distortion, ultrasound 8 mm at 12 o'clock position, axilla negative, ultrasound-guided biopsy revealed grade 1 IDC with DCIS ER 100%, PR 100%, HER-2 -1+ by IHC, Ki-67 3%, T1BN0 stage Ia clinical stage   07/08/2018 Surgery   Left lumpectomy: Grade 2 IDC, 1.3 cm, margins negative, negative for lymphovascular or perineural invasion, ER 100%, PR 90%, HER-2 -1+, Ki-67 3%, T1CNX stage Ia    07/20/2018 Cancer Staging   Staging form: Breast, AJCC 8th Edition - Pathologic: Stage IA (pT1c, pN0, cM0, G2, ER+, PR+, HER2-) - Signed by Loa Socks, NP on 07/20/2018   07/25/2018 Oncotype testing   Oncotype DX recurrence score 4, distant recurrence risk at 9 years: 3%, low risk   08/23/2018 -  Anti-estrogen oral therapy   Anastrozole 1 mg daily   11/01/2018 - 11/30/2018 Radiation Therapy   Adjuvant radiation therapy: 1. Left Breast / 40.05 Gy in 15 fractions 2. Boost / 10 Gy in 5 fractions     CHIEF COMPLIANT: Follow-up of history of breast cancer  HISTORY OF PRESENT ILLNESS:  History of Present Illness The patient, with a history of breast cancer, has completed a five-year treatment regimen. She experienced side effects such as hot flashes, muscle aches, and pains during the treatment. She also reports issues with her hearing aids during this  period. The patient has been prescribed Jardiance to manage her A1c levels for her diabetes. She is also considering weight loss medication in preparation for a knee replacement surgery. The patient had previously started a weight loss injection but had to stop due to availability issues. She plans to restart this medication as it is now available.     ALLERGIES:  is allergic to shellfish-derived products and bee pollen.  MEDICATIONS:  Current Outpatient Medications  Medication Sig Dispense Refill   alendronate (FOSAMAX) 70 MG tablet Take 1 tablet (70 mg total) by mouth once a week. Take with a full glass of water on an empty stomach. 12 tablet 3   aspirin EC 81 MG tablet Take 81 mg by mouth daily.     Azelaic Acid 15 % cream Apply topically.     Calcium Citrate 200 MG TABS Take 500 mg by mouth.     Calcium Citrate-Vitamin D (CALCIUM CITRATE+D3 PETITES) 200-6.25 MG-MCG TABS Take by mouth.     Cholecalciferol (VITAMIN D3) 1000 UNITS CAPS Take 2,000 Units by mouth.     Coenzyme Q10 (COQ10) 100 MG CAPS Take 100 mg by mouth.     Cyanocobalamin 5000 MCG SUBL Place under the tongue.     Empagliflozin (JARDIANCE PO) Take by mouth.     escitalopram (LEXAPRO) 10 MG tablet TAKE 1 TABLET BY MOUTH EVERY DAY 90 tablet 0   glucose blood test strip CHECK BLOOD SUGAR DAILY.     Lancets (ONETOUCH DELICA PLUS LANCET30G) MISC by Does not apply route.     lisinopril (ZESTRIL) 30 MG tablet Take 1  tablet (30 mg total) by mouth daily.     Magnesium 400 MG CAPS Take by mouth.     metFORMIN (GLUCOPHAGE-XR) 500 MG 24 hr tablet Take 500 mg by mouth daily with breakfast.     Omega-3 Fatty Acids (FISH OIL) 1000 MG CAPS Take 1 capsule by mouth.     OVER THE COUNTER MEDICATION Take 1 tablet by mouth at bedtime. Ashwaganda root.     polyethylene glycol (MIRALAX / GLYCOLAX) 17 g packet Take 17 g by mouth daily.     SUMAtriptan (IMITREX) 100 MG tablet 1 po immediately at onset of migraine; may repeat in 2 hrs if HA  persists; no more than 2 in 24 hrs     triamcinolone cream (KENALOG) 0.1 % Apply 1 application topically 2 (two) times daily. 30 g 0   TURMERIC PO Take by mouth.     UNABLE TO FIND Take 2 capsules by mouth daily. CINSULIN     vitamin C (ASCORBIC ACID) 500 MG tablet Take 500 mg by mouth.     Wheat Dextrin (BENEFIBER PO) Take by mouth.     Current Facility-Administered Medications  Medication Dose Route Frequency Provider Last Rate Last Admin   0.9 %  sodium chloride infusion  500 mL Intravenous Once Armbruster, Willaim Rayas, MD       lidocaine (PF) (XYLOCAINE) 1 % injection 0.3 mL  0.3 mL Other Once Tyrell Antonio, MD        PHYSICAL EXAMINATION: ECOG PERFORMANCE STATUS: 1 - Symptomatic but completely ambulatory  Vitals:   08/02/23 1043  BP: 129/63  Pulse: 75  Resp: 17  Temp: (!) 97.3 F (36.3 C)  SpO2: 96%   Filed Weights   08/02/23 1043  Weight: 271 lb 8 oz (123.2 kg)      LABORATORY DATA:  I have reviewed the data as listed    Latest Ref Rng & Units 06/15/2018   12:20 PM  CMP  Glucose 70 - 99 mg/dL 409   BUN 8 - 23 mg/dL 19   Creatinine 8.11 - 1.00 mg/dL 9.14   Sodium 782 - 956 mmol/L 140   Potassium 3.5 - 5.1 mmol/L 4.4   Chloride 98 - 111 mmol/L 104   CO2 22 - 32 mmol/L 24   Calcium 8.9 - 10.3 mg/dL 21.3   Total Protein 6.5 - 8.1 g/dL 7.5   Total Bilirubin 0.3 - 1.2 mg/dL 0.3   Alkaline Phos 38 - 126 U/L 97   AST 15 - 41 U/L 14   ALT 0 - 44 U/L 19     Lab Results  Component Value Date   WBC 9.4 06/15/2018   HGB 14.9 06/15/2018   HCT 43.2 06/15/2018   MCV 88.0 06/15/2018   PLT 251 06/15/2018   NEUTROABS 4.9 06/15/2018    ASSESSMENT & PLAN:  Malignant neoplasm of upper-outer quadrant of left breast in female, estrogen receptor positive (HCC) 07/08/2018: Left lumpectomy: Grade 2 IDC, 1.3 cm, margins negative, negative for lymphovascular or perineural invasion, ER 100%, PR 90%, HER-2 -1+, Ki-67 3%, T1CNX stage Ia Oncotype DX recurrence score 4, risk of  distant recurrence: 3%, low risk Adjuvant radiation therapy started 11/04/2018 Adjuvant antiestrogen therapy started April 2020   Anastrozole toxicities: Severe hot flashes: Continues to be the same. Muscle aches and pains: Planning to get water aerobics. Having completed 5 years of therapy I recommended that we discontinue anastrozole.   Breast cancer surveillance: 1.  Breast exam 08/02/2023: Benign 2.  Mammogram 06/30/2023:  Benign breast density category C  Bone density 07/23/2022: T-score -1.6: Osteopenia: Calcium vitamin D and weightbearing exercises.   Severe arthritis: Patient is seeing a pain physician. Severe stress related to her adult son who has psychiatric issues and is in the hospital at this time.  I renewed her Fosamax and she will get further renewals from her primary care. Return to clinic on an as-needed basis. ------------------------------------- Assessment and Plan Assessment & Plan Malignant neoplasm of upper-outer quadrant of left breast, estrogen receptor positive Completed five years of treatment for low-risk estrogen receptor-positive breast cancer with side effects. No recurrence concern; additional monitoring unnecessary. - Discontinue anastrozole. - Continue annual mammograms in February.  Type 2 Diabetes Mellitus Managing diabetes with Jardiance to lower A1c. Considering additional medication for weight loss before knee replacement surgery. - Continue Jardiance, likely at 25 mg, to manage A1c. - Consider additional weight loss medication, possibly a newer injection, to aid in weight loss before knee replacement surgery.      No orders of the defined types were placed in this encounter.  The patient has a good understanding of the overall plan. she agrees with it. she will call with any problems that may develop before the next visit here. Total time spent: 30 mins including face to face time and time spent for planning, charting and co-ordination of  care   Tamsen Meek, MD 08/02/23

## 2023-08-02 NOTE — Assessment & Plan Note (Signed)
 07/08/2018: Left lumpectomy: Grade 2 IDC, 1.3 cm, margins negative, negative for lymphovascular or perineural invasion, ER 100%, PR 90%, HER-2 -1+, Ki-67 3%, T1CNX stage Ia Oncotype DX recurrence score 4, risk of distant recurrence: 3%, low risk Adjuvant radiation therapy started 11/04/2018 Adjuvant antiestrogen therapy started April 2020   Anastrozole toxicities: Severe hot flashes: Continues to be the same. Muscle aches and pains: Planning to get water aerobics.   Breast cancer surveillance: 1.  Breast exam 08/02/2023: Benign 2.  Mammogram 06/30/2023: Benign breast density category C  Bone density 07/23/2022: T-score -1.6: Osteopenia: Calcium vitamin D and weightbearing exercises.   Severe arthritis: Patient is seeing a pain physician. Severe stress related to her adult son who has psychiatric issues and is in the hospital at this time.   Return to clinic in 1 year for follow-up

## 2024-05-16 ENCOUNTER — Other Ambulatory Visit: Payer: Self-pay | Admitting: Family Medicine

## 2024-05-16 DIAGNOSIS — Z1231 Encounter for screening mammogram for malignant neoplasm of breast: Secondary | ICD-10-CM

## 2024-06-29 ENCOUNTER — Ambulatory Visit
